# Patient Record
Sex: Female | Born: 1935 | Race: White | Hispanic: No | Smoking: Never smoker
Health system: Southern US, Community
[De-identification: ages and names within clinical notes are randomized; demographics above are authoritative.]

## PROBLEM LIST (undated history)

## (undated) DIAGNOSIS — M810 Age-related osteoporosis without current pathological fracture: Secondary | ICD-10-CM

## (undated) DIAGNOSIS — R011 Cardiac murmur, unspecified: Secondary | ICD-10-CM

## (undated) DIAGNOSIS — I10 Essential (primary) hypertension: Secondary | ICD-10-CM

## (undated) DIAGNOSIS — H9190 Unspecified hearing loss, unspecified ear: Secondary | ICD-10-CM

## (undated) DIAGNOSIS — I252 Old myocardial infarction: Secondary | ICD-10-CM

## (undated) DIAGNOSIS — D649 Anemia, unspecified: Secondary | ICD-10-CM

## (undated) DIAGNOSIS — R7309 Other abnormal glucose: Secondary | ICD-10-CM

## (undated) DIAGNOSIS — I2510 Atherosclerotic heart disease of native coronary artery without angina pectoris: Secondary | ICD-10-CM

## (undated) DIAGNOSIS — C801 Malignant (primary) neoplasm, unspecified: Secondary | ICD-10-CM

## (undated) DIAGNOSIS — S22060A Wedge compression fracture of T7-T8 vertebra, initial encounter for closed fracture: Secondary | ICD-10-CM

## (undated) HISTORY — PX: FRACTURE SURGERY: SHX138

## (undated) HISTORY — PX: COLON SURGERY: SHX602

---

## 2019-01-05 ENCOUNTER — Emergency Department (HOSPITAL_COMMUNITY): Payer: Medicare Other

## 2019-01-05 ENCOUNTER — Other Ambulatory Visit: Payer: Self-pay

## 2019-01-05 ENCOUNTER — Inpatient Hospital Stay (HOSPITAL_COMMUNITY)
Admission: EM | Admit: 2019-01-05 | Discharge: 2019-01-11 | DRG: 481 | Disposition: A | Discharge status: Skilled Nursing Facility | Payer: Medicare Other | Attending: Internal Medicine | Admitting: Internal Medicine

## 2019-01-05 ENCOUNTER — Encounter (HOSPITAL_COMMUNITY): Payer: Self-pay | Admitting: Emergency Medicine

## 2019-01-05 DIAGNOSIS — Z20828 Contact with and (suspected) exposure to other viral communicable diseases: Secondary | ICD-10-CM | POA: Diagnosis not present

## 2019-01-05 DIAGNOSIS — Z87891 Personal history of nicotine dependence: Secondary | ICD-10-CM

## 2019-01-05 DIAGNOSIS — Z66 Do not resuscitate: Secondary | ICD-10-CM | POA: Diagnosis not present

## 2019-01-05 DIAGNOSIS — Z79899 Other long term (current) drug therapy: Secondary | ICD-10-CM

## 2019-01-05 DIAGNOSIS — S72142A Displaced intertrochanteric fracture of left femur, initial encounter for closed fracture: Principal | ICD-10-CM | POA: Diagnosis present

## 2019-01-05 DIAGNOSIS — Z9181 History of falling: Secondary | ICD-10-CM

## 2019-01-05 DIAGNOSIS — Z85828 Personal history of other malignant neoplasm of skin: Secondary | ICD-10-CM

## 2019-01-05 DIAGNOSIS — M8580 Other specified disorders of bone density and structure, unspecified site: Secondary | ICD-10-CM

## 2019-01-05 DIAGNOSIS — E876 Hypokalemia: Secondary | ICD-10-CM | POA: Diagnosis not present

## 2019-01-05 DIAGNOSIS — I10 Essential (primary) hypertension: Secondary | ICD-10-CM | POA: Diagnosis present

## 2019-01-05 DIAGNOSIS — S62307A Unspecified fracture of fifth metacarpal bone, left hand, initial encounter for closed fracture: Secondary | ICD-10-CM | POA: Diagnosis present

## 2019-01-05 DIAGNOSIS — I2510 Atherosclerotic heart disease of native coronary artery without angina pectoris: Secondary | ICD-10-CM | POA: Diagnosis present

## 2019-01-05 DIAGNOSIS — S42202D Unspecified fracture of upper end of left humerus, subsequent encounter for fracture with routine healing: Secondary | ICD-10-CM | POA: Diagnosis not present

## 2019-01-05 DIAGNOSIS — H9190 Unspecified hearing loss, unspecified ear: Secondary | ICD-10-CM | POA: Diagnosis present

## 2019-01-05 DIAGNOSIS — D62 Acute posthemorrhagic anemia: Secondary | ICD-10-CM | POA: Diagnosis not present

## 2019-01-05 DIAGNOSIS — S22060D Wedge compression fracture of T7-T8 vertebra, subsequent encounter for fracture with routine healing: Secondary | ICD-10-CM | POA: Diagnosis not present

## 2019-01-05 DIAGNOSIS — W19XXXA Unspecified fall, initial encounter: Secondary | ICD-10-CM

## 2019-01-05 DIAGNOSIS — Z85038 Personal history of other malignant neoplasm of large intestine: Secondary | ICD-10-CM | POA: Diagnosis not present

## 2019-01-05 DIAGNOSIS — Y92009 Unspecified place in unspecified non-institutional (private) residence as the place of occurrence of the external cause: Secondary | ICD-10-CM

## 2019-01-05 DIAGNOSIS — I252 Old myocardial infarction: Secondary | ICD-10-CM | POA: Diagnosis not present

## 2019-01-05 DIAGNOSIS — S72002A Fracture of unspecified part of neck of left femur, initial encounter for closed fracture: Secondary | ICD-10-CM | POA: Diagnosis not present

## 2019-01-05 DIAGNOSIS — I272 Pulmonary hypertension, unspecified: Secondary | ICD-10-CM | POA: Diagnosis not present

## 2019-01-05 DIAGNOSIS — W010XXA Fall on same level from slipping, tripping and stumbling without subsequent striking against object, initial encounter: Secondary | ICD-10-CM | POA: Diagnosis present

## 2019-01-05 DIAGNOSIS — S42295D Other nondisplaced fracture of upper end of left humerus, subsequent encounter for fracture with routine healing: Secondary | ICD-10-CM

## 2019-01-05 DIAGNOSIS — Z419 Encounter for procedure for purposes other than remedying health state, unspecified: Secondary | ICD-10-CM

## 2019-01-05 DIAGNOSIS — D638 Anemia in other chronic diseases classified elsewhere: Secondary | ICD-10-CM | POA: Diagnosis not present

## 2019-01-05 DIAGNOSIS — R296 Repeated falls: Secondary | ICD-10-CM | POA: Diagnosis present

## 2019-01-05 DIAGNOSIS — S62367D Nondisplaced fracture of neck of fifth metacarpal bone, left hand, subsequent encounter for fracture with routine healing: Secondary | ICD-10-CM

## 2019-01-05 DIAGNOSIS — I350 Nonrheumatic aortic (valve) stenosis: Secondary | ICD-10-CM | POA: Diagnosis present

## 2019-01-05 DIAGNOSIS — S62367A Nondisplaced fracture of neck of fifth metacarpal bone, left hand, initial encounter for closed fracture: Secondary | ICD-10-CM

## 2019-01-05 DIAGNOSIS — S42202A Unspecified fracture of upper end of left humerus, initial encounter for closed fracture: Secondary | ICD-10-CM | POA: Diagnosis present

## 2019-01-05 DIAGNOSIS — Z01810 Encounter for preprocedural cardiovascular examination: Secondary | ICD-10-CM | POA: Diagnosis not present

## 2019-01-05 DIAGNOSIS — R011 Cardiac murmur, unspecified: Secondary | ICD-10-CM | POA: Diagnosis not present

## 2019-01-05 DIAGNOSIS — M810 Age-related osteoporosis without current pathological fracture: Secondary | ICD-10-CM | POA: Diagnosis present

## 2019-01-05 DIAGNOSIS — N179 Acute kidney failure, unspecified: Secondary | ICD-10-CM | POA: Diagnosis not present

## 2019-01-05 DIAGNOSIS — S42295A Other nondisplaced fracture of upper end of left humerus, initial encounter for closed fracture: Secondary | ICD-10-CM

## 2019-01-05 HISTORY — DX: Other abnormal glucose: R73.09

## 2019-01-05 HISTORY — DX: Atherosclerotic heart disease of native coronary artery without angina pectoris: I25.10

## 2019-01-05 HISTORY — DX: Age-related osteoporosis without current pathological fracture: M81.0

## 2019-01-05 HISTORY — DX: Malignant (primary) neoplasm, unspecified: C80.1

## 2019-01-05 HISTORY — DX: Old myocardial infarction: I25.2

## 2019-01-05 HISTORY — DX: Essential (primary) hypertension: I10

## 2019-01-05 HISTORY — DX: Unspecified hearing loss, unspecified ear: H91.90

## 2019-01-05 HISTORY — DX: Anemia, unspecified: D64.9

## 2019-01-05 HISTORY — DX: Cardiac murmur, unspecified: R01.1

## 2019-01-05 HISTORY — DX: Hypercalcemia: E83.52

## 2019-01-05 HISTORY — DX: Wedge compression fracture of T7-T8 vertebra, initial encounter for closed fracture: S22.060A

## 2019-01-05 LAB — TYPE AND SCREEN
ABO/RH(D): O NEG
Antibody Screen: POSITIVE

## 2019-01-05 LAB — CBC WITH DIFFERENTIAL/PLATELET
Abs Immature Granulocytes: 0.03 10*3/uL (ref 0.00–0.07)
Basophils Absolute: 0 10*3/uL (ref 0.0–0.1)
Basophils Relative: 0 %
Eosinophils Absolute: 0 10*3/uL (ref 0.0–0.5)
Eosinophils Relative: 1 %
HCT: 33 % — ABNORMAL LOW (ref 36.0–46.0)
Hemoglobin: 10.2 g/dL — ABNORMAL LOW (ref 12.0–15.0)
Immature Granulocytes: 0 %
Lymphocytes Relative: 5 %
Lymphs Abs: 0.4 10*3/uL — ABNORMAL LOW (ref 0.7–4.0)
MCH: 30 pg (ref 26.0–34.0)
MCHC: 30.9 g/dL (ref 30.0–36.0)
MCV: 97.1 fL (ref 80.0–100.0)
Monocytes Absolute: 0.6 10*3/uL (ref 0.1–1.0)
Monocytes Relative: 6 %
Neutro Abs: 7.8 10*3/uL — ABNORMAL HIGH (ref 1.7–7.7)
Neutrophils Relative %: 88 %
Platelets: 244 10*3/uL (ref 150–400)
RBC: 3.4 MIL/uL — ABNORMAL LOW (ref 3.87–5.11)
RDW: 13.5 % (ref 11.5–15.5)
WBC: 8.9 10*3/uL (ref 4.0–10.5)
nRBC: 0 % (ref 0.0–0.2)

## 2019-01-05 LAB — URINALYSIS, ROUTINE W REFLEX MICROSCOPIC
Bilirubin Urine: NEGATIVE
Glucose, UA: NEGATIVE mg/dL
Hgb urine dipstick: NEGATIVE
Ketones, ur: 20 mg/dL — AB
Leukocytes,Ua: NEGATIVE
Nitrite: NEGATIVE
Protein, ur: NEGATIVE mg/dL
Specific Gravity, Urine: 1.017 (ref 1.005–1.030)
pH: 5 (ref 5.0–8.0)

## 2019-01-05 LAB — BASIC METABOLIC PANEL
Anion gap: 9 (ref 5–15)
BUN: 13 mg/dL (ref 8–23)
CO2: 28 mmol/L (ref 22–32)
Calcium: 9.2 mg/dL (ref 8.9–10.3)
Chloride: 108 mmol/L (ref 98–111)
Creatinine, Ser: 0.58 mg/dL (ref 0.44–1.00)
GFR calc Af Amer: >60 mL/min (ref >60)
GFR calc non Af Amer: >60 mL/min (ref >60)
Glucose, Bld: 135 mg/dL — ABNORMAL HIGH (ref 70–99)
Potassium: 3 mmol/L — ABNORMAL LOW (ref 3.5–5.1)
Sodium: 145 mmol/L (ref 135–145)

## 2019-01-05 LAB — PROTIME-INR
INR: 1.1 (ref 0.8–1.2)
Prothrombin Time: 13.6 seconds (ref 11.4–15.2)

## 2019-01-05 LAB — MAGNESIUM: Magnesium: 1.9 mg/dL (ref 1.7–2.4)

## 2019-01-05 LAB — TROPONIN I (HIGH SENSITIVITY)
Troponin I (High Sensitivity): 8 ng/L (ref <18)
Troponin I (High Sensitivity): 8 ng/L (ref <18)

## 2019-01-05 LAB — SARS CORONAVIRUS 2 (TAT 6-24 HRS): SARS Coronavirus 2: NEGATIVE

## 2019-01-05 MED ORDER — MORPHINE SULFATE (PF) 2 MG/ML IV SOLN
0.5000 mg | INTRAVENOUS | Status: DC | PRN
  Administered 2019-01-05 – 2019-01-08 (×3): 0.5 mg via INTRAVENOUS
  Filled 2019-01-05 (×3): qty 1

## 2019-01-05 MED ORDER — POVIDONE-IODINE 10 % EX SWAB
2.0000 "application " | Freq: Once | CUTANEOUS | Status: AC
  Administered 2019-01-06: 2 via TOPICAL

## 2019-01-05 MED ORDER — FENTANYL CITRATE (PF) 100 MCG/2ML IJ SOLN
50.0000 ug | INTRAMUSCULAR | Status: AC | PRN
  Administered 2019-01-05 (×2): 50 ug via INTRAVENOUS
  Filled 2019-01-05 (×2): qty 2

## 2019-01-05 MED ORDER — ENSURE PRE-SURGERY PO LIQD
296.0000 mL | Freq: Once | ORAL | Status: AC
  Administered 2019-01-06: 296 mL via ORAL
  Filled 2019-01-05: qty 296

## 2019-01-05 MED ORDER — CHLORHEXIDINE GLUCONATE 4 % EX LIQD
60.0000 mL | Freq: Once | CUTANEOUS | Status: AC
  Administered 2019-01-06: 4 via TOPICAL

## 2019-01-05 MED ORDER — POTASSIUM CHLORIDE CRYS ER 20 MEQ PO TBCR
40.0000 meq | EXTENDED_RELEASE_TABLET | Freq: Once | ORAL | Status: AC
  Administered 2019-01-05: 40 meq via ORAL
  Filled 2019-01-05: qty 2

## 2019-01-05 MED ORDER — HEPARIN SODIUM (PORCINE) 5000 UNIT/ML IJ SOLN
5000.0000 [IU] | Freq: Three times a day (TID) | INTRAMUSCULAR | Status: DC
  Administered 2019-01-05 – 2019-01-06 (×4): 5000 [IU] via SUBCUTANEOUS
  Filled 2019-01-05 (×4): qty 1

## 2019-01-05 MED ORDER — POTASSIUM CHLORIDE CRYS ER 20 MEQ PO TBCR
40.0000 meq | EXTENDED_RELEASE_TABLET | Freq: Once | ORAL | Status: AC
  Administered 2019-01-05: 15:00:00 40 meq via ORAL
  Filled 2019-01-05: qty 2

## 2019-01-05 MED ORDER — ONDANSETRON HCL 4 MG/2ML IJ SOLN
4.0000 mg | Freq: Once | INTRAMUSCULAR | Status: AC
  Administered 2019-01-05: 4 mg via INTRAVENOUS
  Filled 2019-01-05: qty 2

## 2019-01-05 MED ORDER — CEFAZOLIN SODIUM-DEXTROSE 2-4 GM/100ML-% IV SOLN
2.0000 g | INTRAVENOUS | Status: DC
  Filled 2019-01-05: qty 100

## 2019-01-05 MED ORDER — HYDROCODONE-ACETAMINOPHEN 5-325 MG PO TABS
1.0000 | ORAL_TABLET | Freq: Four times a day (QID) | ORAL | Status: DC | PRN
  Administered 2019-01-05 – 2019-01-11 (×5): 1 via ORAL
  Filled 2019-01-05 (×6): qty 1

## 2019-01-05 NOTE — ED Triage Notes (Signed)
Patient fell today and has pain in the left hip with obvious shortening of the leg according to ems. She did have an injury to the femoral head in September, but the family reported no fracture.

## 2019-01-05 NOTE — Progress Notes (Signed)
Patient ID: Ann Garza, female   DOB: Feb 26, 1922, 85 y.o.   MRN: [id number]  Received consult from [facility] regarding hip fx. Unable to be done there 2/2 heart history. Will need cardiac clearance and will plan to fix tomorrow if she's cleared. NPO after MN. Will see in AM for full consult.    Lisette Abu, PA-C Orthopedic Surgery [phone]

## 2019-01-05 NOTE — H&P (Signed)
History and Physical    Ann Garza [id number] DOB: September 28, 1922 DOA: 01/05/2019  PCP: System, Provider Not In  Patient coming from: home  I have personally briefly reviewed patient's old medical records in [place]  Chief Complaint: fall  HPI: Ann Garza is a 85 y.o. female with medical history significant of falls, is brought to the hospital today after suffering a fall.  Her most recent fall was on 11/2 when she was evaluated at [facility] and was found to have a left humerus fracture.  Patient reports that she had no dizziness prior to her fall today.  She did not complain of chest pain or shortness of breath.  She did not lose consciousness.  She has not had any fever, cough.  Patient was evaluated in the emergency room where x-rays confirmed left-sided hip fracture.  Case was reviewed with orthopedics, Dr. Aline Brochure who felt that patient was too high risk from cardiac perspective to undergo surgery at [facility] and is recommended transfer to [facility].  EDP discussed case with Dr. Doreatha Martin on-call for orthopedics at [facility].  Patient be transferred there for further operative management.  Review of Systems:  General: Positive for generalized weakness, negative for fever, weight loss Respiratory: Negative for shortness of breath, cough, wheezing Cardiac: Negative for chest pain, palpitations GI: Negative for nausea, vomiting, diarrhea All other systems reviewed and found to be negative, pertinent positives noted in HPI.   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 She does not have any smokeless tobacco history on file. She reports previous alcohol use. She reports that she does not use drugs.  No Known Allergies  Family history: Family history reviewed and not pertinent  Prior to Admission medications   Medication Sig Start Date End Date Taking? Authorizing Provider  Multiple Vitamin (MULTIVITAMIN WITH MINERALS) TABS tablet Take 1 tablet by mouth daily.   Yes [provider]  mupirocin ointment (BACTROBAN) 2 % Apply 1 application topically 3 (three) times daily.  01/03/19 01/03/20 Yes [provider]  [place] See admin instructions. Nystatin-Hydrocortisone-Zinc Oxide-Lanolin-dermabase (1:1:1:1:1)  - Apply topically twice daily   Yes [provider]  fluconazole (DIFLUCAN) 150 MG tablet Take by mouth. 01/03/19 01/06/19  [provider]    Physical Exam: Vitals:   01/05/19 1700 01/05/19 1730 01/05/19 1800 01/05/19 1830  BP: (!) 159/75 (!) 158/76 (!) 141/64 (!) 148/68  Pulse: 94 93 99 87  Resp: (!) 22 18 16 18   Temp:      TempSrc:      SpO2: 97% 96% 96% 97%  Weight:      Height:        Constitutional: NAD, calm, comfortable Eyes: PERRL, lids and conjunctivae normal ENMT: Mucous membranes are moist. Posterior pharynx clear of any exudate or lesions.Normal dentition.  Neck: normal,  supple, no masses, no thyromegaly Respiratory: clear to auscultation bilaterally, no wheezing, no crackles. Normal respiratory effort. No accessory muscle use.  Cardiovascular: Regular rate and rhythm, no murmurs / rubs / gallops. No extremity edema. 2+ pedal pulses. No carotid bruits.  Abdomen: no tenderness, no masses palpated. No hepatosplenomegaly. Bowel sounds positive.  Musculoskeletal: Left lower extremity is shortened and externally rotated.  Skin: no rashes, lesions, ulcers. No induration Neurologic: Limited exam by  pain, no gross focal neurologic deficits Psychiatric: Normal judgment and insight. Alert and oriented x 3. Normal mood.    Labs on Admission: I have personally reviewed following labs and imaging studies  CBC: Recent Labs  Lab 01/05/19 1155  WBC 8.9  NEUTROABS 7.8*  HGB 10.2*  HCT 33.0*  MCV 97.1  PLT [id number]   Basic Metabolic Panel: Recent Labs  Lab 01/05/19 1155 01/05/19 1500  NA 145  --   K 3.0*  --   CL 108  --   CO2 28  --   GLUCOSE 135*  --   BUN 13  --   CREATININE 0.58  --   CALCIUM 9.2  --   MG  --  1.9   GFR: Estimated Creatinine Clearance: 28.3 mL/min (by C-G formula based on SCr of 0.58 mg/dL). Liver Function Tests: No results for input(s): AST, ALT, ALKPHOS, BILITOT, PROT, ALBUMIN in the last 168 hours. No results for input(s): LIPASE, AMYLASE in the last 168 hours. No results for input(s): AMMONIA in the last 168 hours. Coagulation Profile: Recent Labs  Lab 01/05/19 1155  INR 1.1   Cardiac Enzymes: No results for input(s): CKTOTAL, CKMB, CKMBINDEX, TROPONINI in the last 168 hours. BNP (last 3 results) No results for input(s): PROBNP in the last 8760 hours. HbA1C: No results for input(s): HGBA1C in the last 72 hours. CBG: No results for input(s): GLUCAP in the last 168 hours. Lipid Profile: No results for input(s): CHOL, HDL, LDLCALC, TRIG, CHOLHDL, LDLDIRECT in the last 72 hours. Thyroid Function Tests: No results for input(s): TSH, T4TOTAL, FREET4, T3FREE, THYROIDAB in the last 72 hours. Anemia Panel: No results for input(s): VITAMINB12, FOLATE, FERRITIN, TIBC, IRON, RETICCTPCT in the last 72 hours. Urine analysis:    Component Value Date/Time   COLORURINE YELLOW 01/05/2019 1128   APPEARANCEUR HAZY (A) 01/05/2019 1128   LABSPEC 1.017 01/05/2019 1128   PHURINE 5.0 01/05/2019 1128   GLUCOSEU NEGATIVE 01/05/2019 1128   HGBUR NEGATIVE 01/05/2019 1128   BILIRUBINUR NEGATIVE 01/05/2019 1128   KETONESUR 20 (A) 01/05/2019 1128   PROTEINUR  NEGATIVE 01/05/2019 1128   NITRITE NEGATIVE 01/05/2019 1128   LEUKOCYTESUR NEGATIVE 01/05/2019 1128    Radiological Exams on Admission: Dg Chest 1 View  Result Date: 01/05/2019 CLINICAL DATA:  85 year old female with history of trauma from a fall today. EXAM: CHEST  1 VIEW COMPARISON:  No priors. FINDINGS: Lung volumes are low. Mild elevation of the right hemidiaphragm, of uncertain chronicity. No acute consolidative airspace disease. No pneumothorax. No definite suspicious appearing pulmonary nodules or masses are noted. No pleural effusions. No evidence of pulmonary edema. Heart size is mildly enlarged. Upper mediastinal contours are within normal limits. Aortic atherosclerosis. Visualized bony thorax appears grossly intact. IMPRESSION: 1. Low lung volumes without radiographic evidence of acute cardiopulmonary disease. 2. Aortic atherosclerosis. 3. Mild cardiomegaly. Electronically Signed   By: Vinnie Langton M.D.   On: 01/05/2019 12:59   Ct Head Wo Contrast  Result Date: 01/05/2019 CLINICAL DATA:  85 year old female with fall. EXAM: CT HEAD WITHOUT  CONTRAST TECHNIQUE: Contiguous axial images were obtained from the base of the skull through the vertex without intravenous contrast. COMPARISON:  None. FINDINGS: Brain: There is mild age-related atrophy and chronic microvascular ischemic changes. There is no acute intracranial hemorrhage. No mass effect or midline shift. No extra-axial fluid collection. Vascular: No hyperdense vessel or unexpected calcification. Skull: Normal. Negative for fracture or focal lesion. Sinuses/Orbits: No acute finding. Other: Left posterior parietal scalp contusion. IMPRESSION: 1. No acute intracranial hemorrhage. 2. Mild age-related atrophy and chronic microvascular ischemic changes. Electronically Signed   By: Anner Crete M.D.   On: 01/05/2019 12:53   Dg Humerus Left  Result Date: 01/05/2019 CLINICAL DATA:  Fall, pre-existing fracture EXAM: LEFT HUMERUS - 2+  VIEW COMPARISON:  None. FINDINGS: Osteopenia. There is an impacted fracture of the surgical neck of the left humerus, which appears to have some evidence of resorption about the fracture fragments and may be subacute. No other fracture or dislocation of the left humerus. Soft tissues are unremarkable. IMPRESSION: 1. Osteopenia. 2. There is an impacted fracture of the surgical neck of the left humerus, which appears to have some evidence of resorption about the fracture fragments and may be subacute. No other fracture or dislocation of the left humerus. Electronically Signed   By: Eddie Candle M.D.   On: 01/05/2019 14:29   Dg Hand Complete Left  Result Date: 01/05/2019 CLINICAL DATA:  Fall, pain EXAM: LEFT HAND - COMPLETE 3+ VIEW COMPARISON:  None. FINDINGS: Profound osteopenia. There is an angulated fracture of the distal left fifth metacarpal. 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 Intravenous catheter in the dorsum of the wrist. IMPRESSION: 1. Profound osteopenia. 2. There is an angulated fracture of the distal left fifth metacarpal. 3. 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 Electronically Signed   By: Eddie Candle M.D.   On: 01/05/2019 14:27   Dg Hip Unilat With Pelvis 2-3 Views Left  Result Date: 01/05/2019 CLINICAL DATA:  Patient fell today and has pain in the left hip with obvious shortening of the leg according to ems. She did have an injury to the femoral head in September, but the family reported no fracture. Hx HTN, CAD, nonsmoker EXAM: DG HIP (WITH OR WITHOUT PELVIS) 2-3V LEFT COMPARISON:  None. FINDINGS: There is a comminuted intertrochanteric fracture of the proximal left femur. The fracture is displaced, distal shaft fracture component displacing laterally by 2 cm. Lesser and greater trochanteric fracture components are also displaced. There is varus angulation. No other fractures.  No bone lesions. Hip joints, SI joints and symphysis  pubis are normally aligned. There is soft tissue edema lateral to the fracture proximal left femur. IMPRESSION: 1. Comminuted, mildly displaced and varus angulated intertrochanteric fracture of the proximal left femur. No dislocation. Electronically Signed   By: Lajean Manes M.D.   On: 01/05/2019 12:59    EKG: Independently reviewed.  Sinus rhythm without any acute changes.  Assessment/Plan Active Problems:   Closed left hip fracture (HCC)   Fall at home, initial encounter   Closed fracture of proximal end of left humerus   Unsp fracture of fifth metacarpal bone, left hand, init   Osteopenia   Hypokalemia     1. Closed left hip fracture.  Secondary to fall.  Plans are for transfer to [facility] for operative management.  We will keep n.p.o. after midnight.  Start on heparin for DVT prophylaxis.  Will need physical therapy evaluation postoperatively.  Will consult  cardiology for preoperative cardiac clearance. 2. Fracture of distal left fifth metacarpal.  Defer further management to orthopedics 3. Recent left humerus fracture.  Continue supportive management per orthopedics 4. Hypokalemia.  Replace.  Check magnesium  DVT prophylaxis: Heparin Code Status: DNR Family Communication: Discussed with son at the bedside Disposition Plan: Transfer to [facility] for further operative management Consults called: Orthopedics, Dr. Doreatha Martin Admission status: Inpatient, MedSurg  Kathie Dike MD [facility]   If 7PM-7AM, please contact night-coverage [email or web address]   01/05/2019, 7:05 PM

## 2019-01-05 NOTE — ED Provider Notes (Signed)
[facility] EMERGENCY DEPARTMENT Provider Note   CSN: [id number] Arrival date & time: 01/05/19  1118     History   Chief Complaint Chief Complaint  Patient presents with   Fall    HPI Ann Garza is a 85 y.o. female.     Pt presents to the ED today with a fall and left hip pain.  The pt has a hx of frequent falls.  She lives with her family.  She is a poor historian.    Pt's son said she used to live in [place], [place] and has been living with him for the last few weeks.  She had multiple falls.  She does walk a little, but uses a wheelchair most of the time.  The pt's son said she was standing and fell over.  He said he was 6 feet away and could not catch her.  She has been healing from a left proximal humerus fx.  She saw her pcp for a routine wellness visit on 11/10 and had an [place] of her LUE which was negative for DVT.        Past Medical History:  Diagnosis Date   Anemia    Cancer ([place])    non melenoma skin/ Colon   Coronary artery disease    cath 06/15/11 EF55%   Elevated glucose    Hearing loss    Heart murmur    suspect aortic stenosis   Hypercalcemia    Hypertension    MI, old    Osteoporosis    Wedge compression fracture of T8 vertebra [facility])     Patient Active Problem List   Diagnosis Date Noted   Closed left hip fracture ([place]) 01/05/2019    Past Surgical History:  Procedure Laterality Date   COLON SURGERY     FRACTURE SURGERY     arm     OB History   No obstetric history on file.      Home Medications    Prior to Admission medications   Medication Sig Start Date End Date Taking? Authorizing Provider  Multiple Vitamin (MULTIVITAMIN WITH MINERALS) TABS tablet Take 1 tablet by mouth daily.   Yes [provider]  mupirocin ointment (BACTROBAN) 2 % Apply 1 application topically 3 (three) times daily.  01/03/19 01/03/20 Yes [provider]  [place] See admin instructions.  Nystatin-Hydrocortisone-Zinc Oxide-Lanolin-dermabase (1:1:1:1:1)  - Apply topically twice daily   Yes [provider]  fluconazole (DIFLUCAN) 150 MG tablet Take by mouth. 01/03/19 01/06/19  [provider]    Family History No family history on file.  Social History Social History   Tobacco Use   Smoking status: Never Smoker  Substance Use Topics   Alcohol use: Not Currently   Drug use: Never     Allergies   Patient has no known allergies.   Review of Systems Review of Systems  Musculoskeletal:       Left hip pain  All other systems reviewed and are negative.    Physical Exam Updated Vital Signs BP (!) 184/87    Pulse 87    Temp 98.5 F (36.9 C) (Oral)    Resp 17    Ht 4\' 11"  (1.499 m)    Wt 41.7 kg    SpO2 96%    BMI 18.58 kg/m   Physical Exam Vitals signs and nursing note reviewed.  Constitutional:      Appearance: Normal appearance.  HENT:     Head: Normocephalic.  Comments: Recent skin cancer removals to face    Right Ear: External ear normal.     Left Ear: External ear normal.     Nose: Nose normal.     Mouth/Throat:     Mouth: Mucous membranes are moist.     Pharynx: Oropharynx is clear.  Eyes:     Extraocular Movements: Extraocular movements intact.     Conjunctiva/sclera: Conjunctivae normal.     Pupils: Pupils are equal, round, and reactive to light.  Neck:     Musculoskeletal: Normal range of motion and neck supple.  Cardiovascular:     Rate and Rhythm: Normal rate and regular rhythm.     Pulses: Normal pulses.     Heart sounds: Normal heart sounds.  Pulmonary:     Effort: Pulmonary effort is normal.     Breath sounds: Normal breath sounds.  Abdominal:     General: Abdomen is flat. Bowel sounds are normal.  Musculoskeletal:     Left hip: She exhibits decreased range of motion and tenderness.     Comments: Edema to LUE.  Skin:    General: Skin is warm.     Capillary Refill: Capillary refill takes less than 2  seconds.     Comments: Multiple skin tears  Neurological:     Mental Status: She is alert. Mental status is at baseline.  Psychiatric:        Mood and Affect: Mood normal.        Behavior: Behavior normal.        Thought Content: Thought content normal.        Judgment: Judgment normal.      ED Treatments / Results  Labs (all labs ordered are listed, but only abnormal results are displayed) Labs Reviewed  BASIC METABOLIC PANEL - Abnormal; Notable for the following components:      Result Value   Potassium 3.0 (*)    Glucose, Bld 135 (*)    All other components within normal limits  CBC WITH DIFFERENTIAL/PLATELET - Abnormal; Notable for the following components:   RBC 3.40 (*)    Hemoglobin 10.2 (*)    HCT 33.0 (*)    Neutro Abs 7.8 (*)    Lymphs Abs 0.4 (*)    All other components within normal limits  SARS CORONAVIRUS 2 (TAT 6-24 HRS)  PROTIME-INR  URINALYSIS, ROUTINE W REFLEX MICROSCOPIC  MAGNESIUM  TYPE AND SCREEN  TROPONIN I (HIGH SENSITIVITY)    EKG EKG Interpretation  Date/Time:  Thursday January 05 2019 11:31:50 EST Ventricular Rate:  76 PR Interval:    QRS Duration: 83 QT Interval:  433 QTC Calculation: 487 R Axis:   7 Text Interpretation: Sinus rhythm Atrial premature complex Abnormal R-wave progression, early transition Borderline prolonged QT interval No old tracing to compare Confirmed by Isla Pence [phone]) on 01/05/2019 11:36:12 AM   Radiology Dg Chest 1 View  Result Date: 01/05/2019 CLINICAL DATA:  85 year old female with history of trauma from a fall today. EXAM: CHEST  1 VIEW COMPARISON:  No priors. FINDINGS: Lung volumes are low. Mild elevation of the right hemidiaphragm, of uncertain chronicity. No acute consolidative airspace disease. No pneumothorax. No definite suspicious appearing pulmonary nodules or masses are noted. No pleural effusions. No evidence of pulmonary edema. Heart size is mildly enlarged. Upper mediastinal contours are  within normal limits. Aortic atherosclerosis. Visualized bony thorax appears grossly intact. IMPRESSION: 1. Low lung volumes without radiographic evidence of acute cardiopulmonary disease. 2. Aortic atherosclerosis. 3. Mild cardiomegaly. Electronically  Signed   By: Vinnie Langton M.D.   On: 01/05/2019 12:59   Ct Head Wo Contrast  Result Date: 01/05/2019 CLINICAL DATA:  85 year old female with fall. EXAM: CT HEAD WITHOUT CONTRAST TECHNIQUE: Contiguous axial images were obtained from the base of the skull through the vertex without intravenous contrast. COMPARISON:  None. FINDINGS: Brain: There is mild age-related atrophy and chronic microvascular ischemic changes. There is no acute intracranial hemorrhage. No mass effect or midline shift. No extra-axial fluid collection. Vascular: No hyperdense vessel or unexpected calcification. Skull: Normal. Negative for fracture or focal lesion. Sinuses/Orbits: No acute finding. Other: Left posterior parietal scalp contusion. IMPRESSION: 1. No acute intracranial hemorrhage. 2. Mild age-related atrophy and chronic microvascular ischemic changes. Electronically Signed   By: Anner Crete M.D.   On: 01/05/2019 12:53   Dg Humerus Left  Result Date: 01/05/2019 CLINICAL DATA:  Fall, pre-existing fracture EXAM: LEFT HUMERUS - 2+ VIEW COMPARISON:  None. FINDINGS: Osteopenia. There is an impacted fracture of the surgical neck of the left humerus, which appears to have some evidence of resorption about the fracture fragments and may be subacute. No other fracture or dislocation of the left humerus. Soft tissues are unremarkable. IMPRESSION: 1. Osteopenia. 2. There is an impacted fracture of the surgical neck of the left humerus, which appears to have some evidence of resorption about the fracture fragments and may be subacute. No other fracture or dislocation of the left humerus. Electronically Signed   By: Eddie Candle M.D.   On: 01/05/2019 14:29   Dg Hand Complete  Left  Result Date: 01/05/2019 CLINICAL DATA:  Fall, pain EXAM: LEFT HAND - COMPLETE 3+ VIEW COMPARISON:  None. FINDINGS: Profound osteopenia. There is an angulated fracture of the distal left fifth metacarpal. 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 Intravenous catheter in the dorsum of the wrist. IMPRESSION: 1. Profound osteopenia. 2. There is an angulated fracture of the distal left fifth metacarpal. 3. 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 Electronically Signed   By: Eddie Candle M.D.   On: 01/05/2019 14:27   Dg Hip Unilat With Pelvis 2-3 Views Left  Result Date: 01/05/2019 CLINICAL DATA:  Patient fell today and has pain in the left hip with obvious shortening of the leg according to ems. She did have an injury to the femoral head in September, but the family reported no fracture. Hx HTN, CAD, nonsmoker EXAM: DG HIP (WITH OR WITHOUT PELVIS) 2-3V LEFT COMPARISON:  None. FINDINGS: There is a comminuted intertrochanteric fracture of the proximal left femur. The fracture is displaced, distal shaft fracture component displacing laterally by 2 cm. Lesser and greater trochanteric fracture components are also displaced. There is varus angulation. No other fractures.  No bone lesions. Hip joints, SI joints and symphysis pubis are normally aligned. There is soft tissue edema lateral to the fracture proximal left femur. IMPRESSION: 1. Comminuted, mildly displaced and varus angulated intertrochanteric fracture of the proximal left femur. No dislocation. Electronically Signed   By: Lajean Manes M.D.   On: 01/05/2019 12:59    Procedures Procedures (including critical care time)  Medications Ordered in ED Medications  fentaNYL (SUBLIMAZE) injection 50 mcg (50 mcg Intravenous Given 01/05/19 1157)  potassium chloride SA (KLOR-CON) CR tablet 40 mEq (has no administration in time range)  ondansetron (ZOFRAN) injection 4 mg (4 mg Intravenous Given 01/05/19  1157)     Initial Impression / Assessment and Plan / ED Course  I have reviewed the triage  vital signs and the nursing notes.  Pertinent labs & imaging results that were available during my care of the patient were reviewed by me and considered in my medical decision making (see chart for details).  Pt d/w Dr. Aline Brochure (ortho) who said he'd look at her chart.  He does not think it's appropriate to do pt's surgery here as she has a heart hx.  Pt d/w Silvestre Gunner (ortho) who said they can operate on her tomorrow at [facility].  K is low at 3.0.  That is replaced.  Mg level ordered.  Pt also has a subacute left proximal humerus fx.  She is put in a sling.  She also has a left 5th mc fx.  She is placed in a splint  Pt d/w Dr. Roderic Palau ([facility]) who will admit.     Final Clinical Impressions(s) / ED Diagnoses   Final diagnoses:  Fall, initial encounter  Closed displaced intertrochanteric fracture of left femur, initial encounter ([place])  Hypokalemia  Other closed nondisplaced fracture of proximal end of left humerus, initial encounter  Closed nondisplaced fracture of neck of fifth metacarpal bone of left hand, initial encounter    ED Discharge Orders    None       Isla Pence, MD 01/05/19 1458

## 2019-01-06 ENCOUNTER — Encounter (HOSPITAL_COMMUNITY): Payer: Self-pay | Admitting: Anesthesiology

## 2019-01-06 ENCOUNTER — Encounter (HOSPITAL_COMMUNITY): Payer: Self-pay | Admitting: *Deleted

## 2019-01-06 ENCOUNTER — Inpatient Hospital Stay (HOSPITAL_COMMUNITY): Payer: Medicare Other

## 2019-01-06 ENCOUNTER — Encounter (HOSPITAL_COMMUNITY)
Admission: EM | Disposition: A | Discharge status: Skilled Nursing Facility | Payer: Self-pay | Source: Home / Self Care | Attending: Internal Medicine

## 2019-01-06 DIAGNOSIS — I350 Nonrheumatic aortic (valve) stenosis: Secondary | ICD-10-CM

## 2019-01-06 DIAGNOSIS — S72142A Displaced intertrochanteric fracture of left femur, initial encounter for closed fracture: Principal | ICD-10-CM

## 2019-01-06 DIAGNOSIS — Z01810 Encounter for preprocedural cardiovascular examination: Secondary | ICD-10-CM

## 2019-01-06 DIAGNOSIS — R011 Cardiac murmur, unspecified: Secondary | ICD-10-CM

## 2019-01-06 DIAGNOSIS — I2510 Atherosclerotic heart disease of native coronary artery without angina pectoris: Secondary | ICD-10-CM

## 2019-01-06 DIAGNOSIS — I272 Pulmonary hypertension, unspecified: Secondary | ICD-10-CM

## 2019-01-06 DIAGNOSIS — S42202D Unspecified fracture of upper end of left humerus, subsequent encounter for fracture with routine healing: Secondary | ICD-10-CM

## 2019-01-06 LAB — CBC
HCT: 25.4 % — ABNORMAL LOW (ref 36.0–46.0)
Hemoglobin: 8 g/dL — ABNORMAL LOW (ref 12.0–15.0)
MCH: 30.3 pg (ref 26.0–34.0)
MCHC: 31.5 g/dL (ref 30.0–36.0)
MCV: 96.2 fL (ref 80.0–100.0)
Platelets: 212 10*3/uL (ref 150–400)
RBC: 2.64 MIL/uL — ABNORMAL LOW (ref 3.87–5.11)
RDW: 13.8 % (ref 11.5–15.5)
WBC: 8.2 10*3/uL (ref 4.0–10.5)
nRBC: 0 % (ref 0.0–0.2)

## 2019-01-06 LAB — ECHOCARDIOGRAM COMPLETE
Height: 59 in
Weight: 1470.91 oz

## 2019-01-06 LAB — BASIC METABOLIC PANEL
Anion gap: 17 — ABNORMAL HIGH (ref 5–15)
BUN: 18 mg/dL (ref 8–23)
CO2: 18 mmol/L — ABNORMAL LOW (ref 22–32)
Calcium: 8.8 mg/dL — ABNORMAL LOW (ref 8.9–10.3)
Chloride: 107 mmol/L (ref 98–111)
Creatinine, Ser: 1.01 mg/dL — ABNORMAL HIGH (ref 0.44–1.00)
GFR calc Af Amer: 55 mL/min — ABNORMAL LOW (ref >60)
GFR calc non Af Amer: 48 mL/min — ABNORMAL LOW (ref >60)
Glucose, Bld: 103 mg/dL — ABNORMAL HIGH (ref 70–99)
Potassium: 4.8 mmol/L (ref 3.5–5.1)
Sodium: 142 mmol/L (ref 135–145)

## 2019-01-06 LAB — MRSA PCR SCREENING: MRSA by PCR: NEGATIVE

## 2019-01-06 LAB — PREPARE RBC (CROSSMATCH)

## 2019-01-06 SURGERY — FIXATION, FRACTURE, INTERTROCHANTERIC, WITH INTRAMEDULLARY ROD
Anesthesia: Choice | Laterality: Left

## 2019-01-06 MED ORDER — FENTANYL CITRATE (PF) 250 MCG/5ML IJ SOLN
INTRAMUSCULAR | Status: AC
  Filled 2019-01-06: qty 5

## 2019-01-06 MED ORDER — SODIUM CHLORIDE 0.9% IV SOLUTION
Freq: Once | INTRAVENOUS | Status: AC
  Administered 2019-01-06: 15:00:00 via INTRAVENOUS

## 2019-01-06 MED ORDER — CEFAZOLIN SODIUM-DEXTROSE 2-4 GM/100ML-% IV SOLN
INTRAVENOUS | Status: AC
  Filled 2019-01-06: qty 100

## 2019-01-06 NOTE — Progress Notes (Signed)
PROGRESS NOTE    Ann Garza  [id number] DOB: Sep 27, 1922 DOA: 01/05/2019 PCP: System, Provider Not In    Brief Narrative:  Ann Garza is a 85 y.o. female with medical history significant of falls, is brought to the hospital today after suffering a fall.  Her most recent fall was on 11/2 when she was evaluated at [facility] and was found to have a left humerus fracture.  Patient reports that she had no dizziness prior to her fall today.  She did not complain of chest pain or shortness of breath.  She did not lose consciousness.  She has not had any fever, cough.  Patient was evaluated in the emergency room where x-rays confirmed left-sided hip fracture.  Case was reviewed with orthopedics, Dr. Aline Brochure who felt that patient was too high risk from cardiac perspective to undergo surgery at [facility] and is recommended transfer to [facility].  EDP discussed case with Dr. Doreatha Martin on-call for orthopedics at [facility].    Patient transferred here early morning on 01/06/2019.  She is pending cardiology clearance for orthopedic surgery early this afternoon.  Assessment & Plan:   Principal Problem:   Closed left hip fracture ([place]) Active Problems:   Fall at home, initial encounter   Closed fracture of proximal end of left humerus   Unsp fracture of fifth metacarpal bone, left hand, init   Osteopenia   Hypokalemia  Left hip fracture -Ortho following -Cardiology consulted for clearance -Pain management per surgical team -She will need PT and OT postoperatively  AKI -We will need perioperative IV fluids and encourage p.o. intake postoperatively -Avoid nephrotoxic medications -Monitor urine output and daily creatinine  Coronary artery disease -Cardiology consulted for clearance  Frequent falls -PT and OT postoperatively -Case management and social work will need to be involved for safe placement and disposition eventually   DVT prophylaxis: Heparin SQ Code  Status:DNR   Consultants:   Orthopedic surgery  Cardiology  Procedures:   Pending surgery for left hip fracture  Antimicrobials:  None   Subjective: Patient is in mild pain but not much pain unless she moves, per patient.  Objective: Vitals:   01/06/19 0748 01/06/19 0753 01/06/19 0957 01/06/19 1139  BP: (!) 118/30 124/68  (!) 141/67  Pulse: 64   (!) 102  Resp: 16 16  16   Temp: 98.4 F (36.9 C) 98.2 F (36.8 C)  98.1 F (36.7 C)  TempSrc: Oral Oral  Oral  SpO2: 97% 98%  93%  Weight:   41.7 kg   Height:   4\' 11"  (1.499 m)     Intake/Output Summary (Last 24 hours) at 01/06/2019 1525 Last data filed at 01/06/2019 1300 Gross per 24 hour  Intake 240 ml  Output 100 ml  Net 140 ml   Filed Weights   01/05/19 1415 01/06/19 0957  Weight: 41.7 kg 41.7 kg    Examination:  General: Elderly frail Caucasian female resting comfortably in no acute distress. HEENT: Normocephalic and atraumatic. Sclera clear. EOMs intact. Neck: Supple.  Heart: RRR. Distinct S1 and S2.   Multiple systolic murmurs on exam.  No gallops or rubs. Radial and distal pedal pulses 2+ and equal bilaterally. Lungs: Clear to ausculation bilaterally. No wheezes, rhonchi, or rales.  Abdomen: Soft, non-distended, and non-tender to palpation. Bowel sounds present. MSK: Normal strength and tone for age. Extremities: No clubbing, cyanosis, or edema.    Tender to palpation in the left lateral hip region  Skin: Warm and  dry. Neuro: Alert and oriented x3. No focal deficits. Psych: Normal affect. Responds appropriately.    LOS: 1 day    Time spent: 35 minutes    Charolotte Capuchin, MD [facility] Pager [phone]  If 7PM-7AM, please contact night-coverage [email or web address] Password [email or web address] 01/06/2019, 3:25 PM

## 2019-01-06 NOTE — Progress Notes (Signed)
Brief cardiology progress note, update:  Echo read: Difficult images given inability to change positions. Overall preserved EF. Aortic valve is severely calcified. Moderate AS by gradients, severe AS by valve area (though valve area calculation is prone to overestimation). Small LV cavity with small stroke volume but normal EF. Tricuspid regurgitation not well visualized on this imaging, therefore cannot estimate RVSP. Mitral valve also with difficult angle on imaging, no significant MR captured but cannot be fully excluded on this imaging. TR and MR unlikely to be severe given lack of doppler signal.  She is intermediate risk for surgery, recommend close monitoring and avoiding large hemodynamic shifts if possible. She may be low flow low gradient AS given her small stroke volume (despite normal EF). Would avoid preload reducing agents given aortic stenosis.  Buford Dresser, MD, PhD [facility]  [address] [place], [place] [address] [phone]

## 2019-01-06 NOTE — Plan of Care (Signed)
  Problem: Activity: Goal: Ability to ambulate and perform ADLs will improve 01/06/2019 2307 by Junius Roads, RN Outcome: Progressing 01/06/2019 2306 by Junius Roads, RN Outcome: Progressing   Problem: Clinical Measurements: Goal: Ability to maintain clinical measurements within normal limits will improve Outcome: Progressing   Problem: Nutrition: Goal: Adequate nutrition will be maintained Outcome: Progressing   Problem: Coping: Goal: Level of anxiety will decrease Outcome: Progressing   Problem: Pain Managment: Goal: General experience of comfort will improve Outcome: Progressing   Problem: Safety: Goal: Ability to remain free from injury will improve Outcome: Progressing

## 2019-01-06 NOTE — Consult Note (Signed)
Reason for Consult:Left hip fx Referring Physician: [name] is an 85 y.o. female.  HPI: Ann Garza fell at home yesterday. She has Ann history of frequent falls and fell recently breaking her left humerus. She seemed to get lightheaded and then fell onto her left side. Her son tried to catch her but did not get there in time. She was taken to [facility] where x-rays showed Ann left hip fx. Orthopedics there was uncomfortable taking her to the OR given her cardiac history and requested transfer to [facility] for clearance and definitive care. She c/o left hip pain. She is very HoH and her left ear is the better one.  Past Medical History:  Diagnosis Date  . Anemia   . Cancer ([place])    non melenoma skin/ Colon  . Coronary artery disease    cath 06/15/11 EF55%  . Elevated glucose   . Hearing loss   . Heart murmur    suspect aortic stenosis  . Hypercalcemia   . Hypertension   . MI, old   . Osteoporosis   . Wedge compression fracture of T8 vertebra [facility])     Past Surgical History:  Procedure Laterality Date  . COLON SURGERY    . FRACTURE SURGERY     arm    No family history on file.  Social History:  reports that she has never smoked. She does not have any smokeless tobacco history on file. She reports previous alcohol use. She reports that she does not use drugs.  Allergies: No Known Allergies  Medications: I have reviewed the patient's current medications.  Results for orders placed or performed during the hospital encounter of 01/05/19 (from the past 48 hour(s))  Urinalysis, Routine w reflex microscopic     Status: Abnormal   Collection Time: 01/05/19 11:28 AM  Result Value Ref Range   Color, Urine YELLOW YELLOW   APPearance HAZY (Ann) CLEAR   Specific Gravity, Urine 1.017 1.005 - 1.030   pH 5.0 5.0 - 8.0   Glucose, UA NEGATIVE NEGATIVE mg/dL   Hgb urine dipstick NEGATIVE NEGATIVE   Bilirubin Urine NEGATIVE NEGATIVE   Ketones, ur 20 (Ann) NEGATIVE mg/dL   Protein, ur NEGATIVE  NEGATIVE mg/dL   Nitrite NEGATIVE NEGATIVE   Leukocytes,Ua NEGATIVE NEGATIVE    Comment: Performed at [facility], [address] [place], [place] [address]  SARS CORONAVIRUS 2 (TAT 6-24 HRS) Nasopharyngeal Nasopharyngeal Swab     Status: None   Collection Time: 01/05/19 11:37 AM   Specimen: Nasopharyngeal Swab  Result Value Ref Range   SARS Coronavirus 2 NEGATIVE NEGATIVE    Comment: (NOTE) SARS-CoV-2 target nucleic acids are NOT DETECTED. The SARS-CoV-2 RNA is generally detectable in upper and lower respiratory specimens during the acute phase of infection. Negative results do not preclude SARS-CoV-2 infection, do not rule out co-infections with other pathogens, and should not be used as the sole basis for treatment or other patient management decisions. Negative results must be combined with clinical observations, patient history, and epidemiological information. The expected result is Negative. Fact Sheet for Patients: SugarRoll.be Fact Sheet for Healthcare Providers: https://www.woods-mathews.com/ This test is not yet approved or cleared by the [place] FDA and  has been authorized for detection and/or diagnosis of SARS-CoV-2 by FDA under an Emergency Use Authorization (EUA). This EUA will remain  in effect (meaning this test can be used) for the duration of the COVID-19 declaration under Section 56 4(b)(1) of the Act, 21 U.S.C. section 360bbb-3(b)(1),  unless the authorization is terminated or revoked sooner. Performed at [facility], [place] [address], [place], [place] [id number]   Basic metabolic panel     Status: Abnormal   Collection Time: 01/05/19 11:55 AM  Result Value Ref Range   Sodium 145 135 - 145 mmol/L   Potassium 3.0 (L) 3.5 - 5.1 mmol/L   Chloride 108 98 - 111 mmol/L   CO2 28 22 - 32 mmol/L   Glucose, Bld 135 (H) 70 - 99 mg/dL   BUN 13 8 - 23 mg/dL   Creatinine, Ser 0.58 0.44 - 1.00 mg/dL   Calcium 9.2 8.9  - 10.3 mg/dL   GFR calc non Af Amer >60 >60 mL/min   GFR calc Af Amer >60 >60 mL/min   Anion gap 9 5 - 15    Comment: Performed at [facility], [address], [place], [place] [address]  CBC WITH DIFFERENTIAL     Status: Abnormal   Collection Time: 01/05/19 11:55 AM  Result Value Ref Range   WBC 8.9 4.0 - 10.5 K/uL   RBC 3.40 (L) 3.87 - 5.11 MIL/uL   Hemoglobin 10.2 (L) 12.0 - 15.0 g/dL   HCT 33.0 (L) 36.0 - 46.0 %   MCV 97.1 80.0 - 100.0 fL   MCH 30.0 26.0 - 34.0 pg   MCHC 30.9 30.0 - 36.0 g/dL   RDW 13.5 11.5 - 15.5 %   Platelets 244 150 - 400 K/uL   nRBC 0.0 0.0 - 0.2 %   Neutrophils Relative % 88 %   Neutro Abs 7.8 (H) 1.7 - 7.7 K/uL   Lymphocytes Relative 5 %   Lymphs Abs 0.4 (L) 0.7 - 4.0 K/uL   Monocytes Relative 6 %   Monocytes Absolute 0.6 0.1 - 1.0 K/uL   Eosinophils Relative 1 %   Eosinophils Absolute 0.0 0.0 - 0.5 K/uL   Basophils Relative 0 %   Basophils Absolute 0.0 0.0 - 0.1 K/uL   Immature Granulocytes 0 %   Abs Immature Granulocytes 0.03 0.00 - 0.07 K/uL    Comment: Performed at [facility], [address] [place], [place] [address]  Protime-INR     Status: None   Collection Time: 01/05/19 11:55 AM  Result Value Ref Range   Prothrombin Time 13.6 11.4 - 15.2 seconds   INR 1.1 0.8 - 1.2    Comment: (NOTE) INR goal varies based on device and disease states. Performed at [facility], [address], [place], [place] [address]   Type and screen [facility]     Status: None   Collection Time: 01/05/19 11:55 AM  Result Value Ref Range   ABO/RH(D)      Jenetta Downer NEG Performed at [place] [place], [place] [address]    Antibody Screen      POS Performed at [facility], [address], [place], [place] [address]    Sample Expiration      01/08/2019,2359 Performed at [facility], [address] [place], [place] [address]    Antibody Identification      ANTI D Performed at [facility], [place] [address]  [place],  [address]   Troponin I (High Sensitivity)     Status: None   Collection Time: 01/05/19  3:00 PM  Result Value Ref Range   Troponin I (High Sensitivity) 8 <18 ng/L    Comment: (NOTE) Elevated high sensitivity troponin I (hsTnI) values and significant  changes across serial measurements  may suggest ACS but many other  chronic and acute conditions are known to elevate hsTnI results.  Refer to the "Links" section for chest pain algorithms and additional  guidance. Performed at [facility], [address] [place], [place] [address]   Magnesium     Status: None   Collection Time: 01/05/19  3:00 PM  Result Value Ref Range   Magnesium 1.9 1.7 - 2.4 mg/dL    Comment: Performed at [facility], [address] [place], [place] [address]  Troponin I (High Sensitivity)     Status: None   Collection Time: 01/05/19  5:52 PM  Result Value Ref Range   Troponin I (High Sensitivity) 8 <18 ng/L    Comment: (NOTE) Elevated high sensitivity troponin I (hsTnI) values and significant  changes across serial measurements may suggest ACS but many other  chronic and acute conditions are known to elevate hsTnI results.  Refer to the "Links" section for chest pain algorithms and additional  guidance. Performed at [facility], [address], [place], [place] [address]   MRSA PCR Screening     Status: None   Collection Time: 01/06/19  6:33 AM   Specimen: Nasal Mucosa; Nasopharyngeal  Result Value Ref Range   MRSA by PCR NEGATIVE NEGATIVE    Comment:        The GeneXpert MRSA Assay (FDA approved for NASAL specimens only), is one component of Ann comprehensive MRSA colonization surveillance program. It is not intended to diagnose MRSA infection nor to guide or monitor treatment for MRSA infections. Performed at [facility], [place] [address] [place],  [address]     Dg Chest 1 View  Result Date: 01/05/2019 CLINICAL DATA:  85 year old female with history of trauma from Ann fall  today. EXAM: CHEST  1 VIEW COMPARISON:  No priors. FINDINGS: Lung volumes are low. Mild elevation of the right hemidiaphragm, of uncertain chronicity. No acute consolidative airspace disease. No pneumothorax. No definite suspicious appearing pulmonary nodules or masses are noted. No pleural effusions. No evidence of pulmonary edema. Heart size is mildly enlarged. Upper mediastinal contours are within normal limits. Aortic atherosclerosis. Visualized bony thorax appears grossly intact. IMPRESSION: 1. Low lung volumes without radiographic evidence of acute cardiopulmonary disease. 2. Aortic atherosclerosis. 3. Mild cardiomegaly. Electronically Signed   By: Vinnie Langton M.D.   On: 01/05/2019 12:59   Ct Head Wo Contrast  Result Date: 01/05/2019 CLINICAL DATA:  85 year old female with fall. EXAM: CT HEAD WITHOUT CONTRAST TECHNIQUE: Contiguous axial images were obtained from the base of the skull through the vertex without intravenous contrast. COMPARISON:  None. FINDINGS: Brain: There is mild age-related atrophy and chronic microvascular ischemic changes. There is no acute intracranial hemorrhage. No mass effect or midline shift. No extra-axial fluid collection. Vascular: No hyperdense vessel or unexpected calcification. Skull: Normal. Negative for fracture or focal lesion. Sinuses/Orbits: No acute finding. Other: Left posterior parietal scalp contusion. IMPRESSION: 1. No acute intracranial hemorrhage. 2. Mild age-related atrophy and chronic microvascular ischemic changes. Electronically Signed   By: Anner Crete M.D.   On: 01/05/2019 12:53   Dg Humerus Left  Result Date: 01/05/2019 CLINICAL DATA:  Fall, pre-existing fracture EXAM: LEFT HUMERUS - 2+ VIEW COMPARISON:  None. FINDINGS: Osteopenia. There is an impacted fracture of the surgical neck of the left humerus, which appears to have some evidence of resorption about the fracture fragments and may be subacute. No other fracture or dislocation of the  left humerus. Soft tissues are unremarkable. IMPRESSION:  1. Osteopenia. 2. There is an impacted fracture of the surgical neck of the left humerus, which appears to have some evidence of resorption about the fracture fragments and may be subacute. No other fracture or dislocation of the left humerus. Electronically Signed   By: Eddie Candle M.D.   On: 01/05/2019 14:29   Dg Hand Complete Left  Result Date: 01/05/2019 CLINICAL DATA:  Fall, pain EXAM: LEFT HAND - COMPLETE 3+ VIEW COMPARISON:  None. FINDINGS: Profound osteopenia. There is an angulated fracture of the distal left fifth metacarpal. 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 Intravenous catheter in the dorsum of the wrist. IMPRESSION: 1. Profound osteopenia. 2. There is an angulated fracture of the distal left fifth metacarpal. 3. 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 Electronically Signed   By: Eddie Candle M.D.   On: 01/05/2019 14:27   Dg Hip Unilat With Pelvis 2-3 Views Left  Result Date: 01/05/2019 CLINICAL DATA:  Patient fell today and has pain in the left hip with obvious shortening of the leg according to ems. She did have an injury to the femoral head in September, but the family reported no fracture. Hx HTN, CAD, nonsmoker EXAM: DG HIP (WITH OR WITHOUT PELVIS) 2-3V LEFT COMPARISON:  None. FINDINGS: There is Ann comminuted intertrochanteric fracture of the proximal left femur. The fracture is displaced, distal shaft fracture component displacing laterally by 2 cm. Lesser and greater trochanteric fracture components are also displaced. There is varus angulation. No other fractures.  No bone lesions. Hip joints, SI joints and symphysis pubis are normally aligned. There is soft tissue edema lateral to the fracture proximal left femur. IMPRESSION: 1. Comminuted, mildly displaced and varus angulated intertrochanteric fracture of the proximal left femur. No dislocation. Electronically Signed    By: Lajean Manes M.D.   On: 01/05/2019 12:59    Review of Systems  Constitutional: Negative for weight loss.  HENT: Negative for ear discharge, ear pain, hearing loss and tinnitus.   Eyes: Negative for blurred vision, double vision, photophobia and pain.  Respiratory: Negative for cough, sputum production and shortness of breath.   Cardiovascular: Negative for chest pain.  Gastrointestinal: Negative for abdominal pain, nausea and vomiting.  Genitourinary: Negative for dysuria, flank pain, frequency and urgency.  Musculoskeletal: Positive for falls and joint pain (Left hip, left arm). Negative for back pain, myalgias and neck pain.  Neurological: Negative for dizziness, tingling, sensory change, focal weakness, loss of consciousness and headaches.  Endo/Heme/Allergies: Does not bruise/bleed easily.  Psychiatric/Behavioral: Negative for depression, memory loss and substance abuse. The patient is not nervous/anxious.    Blood pressure 124/68, pulse 64, temperature 98.2 F (36.8 C), temperature source Oral, resp. rate 16, height 4\' 11"  (1.499 m), weight 41.7 kg, SpO2 98 %. Physical Exam  Constitutional: She appears well-developed and well-nourished. No distress.  HENT:  Head: Normocephalic and atraumatic.  Eyes: Conjunctivae are normal. Right eye exhibits no discharge. Left eye exhibits no discharge. No scleral icterus.  Neck: Normal range of motion.  Cardiovascular: Normal rate and regular rhythm.  Respiratory: Effort normal. No respiratory distress.  Musculoskeletal:     Comments: LLE No traumatic wounds, ecchymosis, or rash  Mod TTP hip  No knee or ankle effusion  Knee stable to varus/ valgus and anterior/posterior stress  Sens DPN, SPN, TN intact  Motor EHL, ext, flex, evers 5/5  DP 1+, PT 1+, No significant edema  Neurological: She is alert.  Skin: Skin is warm and dry.  She is not diaphoretic.  Psychiatric: She has Ann normal mood and affect. Her behavior is normal.     Assessment/Plan: Left hip fx -- Plan IMN today as long as we can get cardiac clearance. If not can push surgery to tomorrow. Continue NPO for now. Multiple medical problems including CAD, AS, hearing loss, HTN -- per primary service    Lisette Abu, PA-C Orthopedic Surgery [phone] 01/06/2019, 9:17 AM

## 2019-01-06 NOTE — Progress Notes (Signed)
Report given to Leanne in [place].

## 2019-01-06 NOTE — Progress Notes (Signed)
Patient's daughter-in-law Ann Garza called related to the status of patient and pending surgery.   Ann Garza self-identified as patient's son, Ann Garza, wife.  RN explained to daughter-in-law that patient is scheduled for surgery on today. RN also explained visitor and visitation policy.  Patient is resting comfortably in bed.   Daughter-in-law was pleasant and informed RN that patient's son who is also the POA will be the primary visitor.

## 2019-01-06 NOTE — Progress Notes (Signed)
Cardiology in with patient stating patient needed an echo prior to surgery. Surgery cancelled for today. Report called to floor RN. Verbalized understanding. Transported patient back to room with son at the bedside.

## 2019-01-06 NOTE — Anesthesia Preprocedure Evaluation (Addendum)
Anesthesia Evaluation  Patient identified by MRN, date of birth, ID band Patient confused    Reviewed: Allergy & Precautions, NPO status , Patient's Chart, lab work & pertinent test results  History of Anesthesia Complications Negative for: history of anesthetic complications  Airway Mallampati: III   Neck ROM: Limited  Mouth opening: Limited Mouth Opening  Dental  (+) Dental Advisory Given   Pulmonary neg pulmonary ROS,    Pulmonary exam normal        Cardiovascular hypertension, + CAD and + Past MI  + Valvular Problems/Murmurs AS  Rhythm:Regular Rate:Normal + Systolic murmurs  '20 TTE - EF 65 to 70%. Grade I diastolic dysfunction (impaired relaxation). Moderate AS. Doppler beam alignment was suboptimal.  '13 Cath - 1. Very mild nonobstructive two-vessel coronary artery disease. 2. Regional wall motion abnormality with overall preserved left ventricular systolic function most consistent with apical ballooning syndrome. 3. Trace-to-mild mitral insufficiency. 4. Normal hemodynamics.   Neuro/Psych negative neurological ROS  negative psych ROS   GI/Hepatic negative GI ROS, Neg liver ROS,   Endo/Other  negative endocrine ROS  Renal/GU negative Renal ROS     Musculoskeletal negative musculoskeletal ROS (+)   Abdominal   Peds  Hematology  (+) anemia ,   Anesthesia Other Findings   Reproductive/Obstetrics                          Anesthesia Physical Anesthesia Plan  ASA: IV  Anesthesia Plan: General   Post-op Pain Management:    Induction: Intravenous  PONV Risk Score and Plan: 3 and Treatment may vary due to age or medical condition, Ondansetron and Propofol infusion  Airway Management Planned: LMA  Additional Equipment: Arterial line  Intra-op Plan:   Post-operative Plan: Extubation in OR  Informed Consent: I have reviewed the patients History and Physical, chart, labs and  discussed the procedure including the risks, benefits and alternatives for the proposed anesthesia with the patient or authorized representative who has indicated his/her understanding and acceptance.   Patient has DNR.  Discussed DNR with power of attorney and Suspend DNR.   Dental advisory given and Consent reviewed with POA  Plan Discussed with: CRNA and Anesthesiologist  Anesthesia Plan Comments: (Discussed anesthesia plan at length with patient's son and daughter in law via telephone. Family in agreement with anesthesia plan. As per DNR, it is suspended for the procedure. Family wishes medical team to use best judgement in regards to implementing chest compressions given patient's age and frailty. )      Anesthesia Quick Evaluation

## 2019-01-06 NOTE — Anesthesia Preprocedure Evaluation (Deleted)
Anesthesia Evaluation  Patient identified by MRN, date of birth, ID band Patient awake    Reviewed: Allergy & Precautions, NPO status , Patient's Chart, lab work & pertinent test results  Airway        Dental   Pulmonary neg pulmonary ROS,           Cardiovascular hypertension, + CAD and + Past MI    Hx old MI   Last cath at [facility] 2013: EF55%   Neuro/Psych negative neurological ROS  negative psych ROS   GI/Hepatic negative GI ROS, Neg liver ROS,   Endo/Other  negative endocrine ROS  Renal/GU negative Renal ROS  negative genitourinary   Musculoskeletal negative musculoskeletal ROS (+) T8 compression fx   Abdominal   Peds negative pediatric ROS (+)  Hematology  (+) anemia , hct 33, plt 244  Hx skin ca, CRC   Anesthesia Other Findings [place] 11/2- L humerus fx, left hip fx, left 5th metacarpal fx  Reproductive/Obstetrics negative OB ROS                             Anesthesia Physical Anesthesia Plan Anesthesia Quick Evaluation

## 2019-01-06 NOTE — Consult Note (Signed)
Cardiology Consultation:   Patient ID: Ann Garza MRN: [id number]; DOB: Jul 13, 1922  Admit date: 01/05/2019 Date of Consult: 01/06/2019  Primary Care Provider: System, Provider Not In Primary Cardiologist: No primary care provider on file.  Primary Electrophysiologist:  None    Patient Profile:   Ann Garza is a 85 y.o. female with a history of CAD with very mild 2 vessel CAD on cardiac cath in 2013 at [facility], mild to moderate tricuspid regurgitation on Echo in 2013, hypertension, anemia, and hearing loss who is being seen today for pre-op evaluation for left hip fracture at the request of Dr. Theresa Duty.  History of Present Illness:   Ann Garza is a 85 year old female with the above history.  Patient extremely hard of hearing so son who helped give history.  Son reports that patient had a mild heart attack in 2013 for which she was treated at [facility] in [place].  Per chart review in care everywhere cardiac catheterization at that time showed very mild nonobstructive two-vessel CAD and some regional wall motion abnormalities with overall preserved LV function most consistent with apical ballooning syndrome.  Echo at that time showed LVEF of 45 to 50% with mild LVH, mild to moderate TR, trace MR, elevated RVSP of 50 to 60 mm per mercury consistent with moderately severe pulmonary hypertension.  Son reports that patient has not seen a cardiologist since this time.  Patient has had multiple mechanical falls recently.  She fell down concrete steps outside on 10/29 and fractured her left humerus.  She was evaluated at [place] any cardiac symptoms prior to fall at that time.  Prior to this event, she was living alone.  However after discharge, she moved in with son and daughter-in-law and received home PT.  Patient presented ED yesterday following another mechanical fall and was found to have intertrochanteric fracture of the proximal left femur.  Son  describes the event as patient being unsteady on her feet.  Son tried to catch the patient but her feet slipped out from underneath her and she fell hitting the back of her head and landing on her left hip.  Patient denies any cardiac symptoms prior to the fall including chest pain, shortness of breath, palpitations, lightheadedness, dizziness.  No syncope.  No active CHF symptoms.  Cardiology was consulted for preop evaluation.  At the time of this evaluation, patient resting comfortably in preop area.  She reports some mild left hip pain at this time but otherwise has no complaints.  Patient has remote smoking history but but son reports she has not smoked in many years.  No known family history of cardiovascular disease her son.  Heart Pathway Score:     Past Medical History:  Diagnosis Date   Anemia    Cancer ([place])    non melenoma skin/ Colon   Coronary artery disease    cath 06/15/11 EF55%   Elevated glucose    Hearing loss    Heart murmur    suspect aortic stenosis   Hypercalcemia    Hypertension    MI, old    Osteoporosis    Wedge compression fracture of T8 vertebra (HCC)     Past Surgical History:  Procedure Laterality Date   COLON SURGERY     FRACTURE SURGERY     arm     Home Medications:  Prior to Admission medications   Medication Sig Start Date End Date Taking? Authorizing Provider  Multiple Vitamin (MULTIVITAMIN  WITH MINERALS) TABS tablet Take 1 tablet by mouth daily.   Yes [provider]  mupirocin ointment (BACTROBAN) 2 % Apply 1 application topically 3 (three) times daily.  01/03/19 01/03/20 Yes [provider]  [place] See admin instructions. Nystatin-Hydrocortisone-Zinc Oxide-Lanolin-dermabase (1:1:1:1:1)  - Apply topically twice daily   Yes [provider]  fluconazole (DIFLUCAN) 150 MG tablet Take by mouth. 01/03/19 01/06/19  [provider]    Inpatient Medications: Scheduled Meds:   [MAR Hold] heparin  5,000 Units Subcutaneous Q8H   Continuous Infusions:   ceFAZolin (ANCEF) IV     PRN Meds: [MAR Hold] HYDROcodone-acetaminophen, [MAR Hold]  morphine injection  Allergies:   No Known Allergies  Social History:   Social History   Socioeconomic History   Marital status: Widowed    Spouse name: Not on file   Number of children: Not on file   Years of education: Not on file   Highest education level: Not on file  Occupational History   Not on file  Social Needs   Financial resource strain: Not on file   Food insecurity    Worry: Not on file    Inability: Not on file   Transportation needs    Medical: Not on file    Non-medical: Not on file  Tobacco Use   Smoking status: Never Smoker  Substance and Sexual Activity   Alcohol use: Not Currently   Drug use: Never   Sexual activity: Not Currently  Lifestyle   Physical activity    Days per week: Not on file    Minutes per session: Not on file   Stress: Not on file  Relationships   Social connections    Talks on phone: Not on file    Gets together: Not on file    Attends religious service: Not on file    Active member of club or organization: Not on file    Attends meetings of clubs or organizations: Not on file    Relationship status: Not on file   Intimate partner violence    Fear of current or ex partner: Not on file    Emotionally abused: Not on file    Physically abused: Not on file    Forced sexual activity: Not on file  Other Topics Concern   Not on file  Social History Narrative   Not on file    Family History:   History reviewed. No pertinent family history.   ROS:  Please see the history of present illness.  All other ROS reviewed and negative.     Physical Exam/Data:   Vitals:   01/06/19 0442 01/06/19 0748 01/06/19 0753 01/06/19 0957  BP: (!) 155/66 (!) 118/30 124/68   Pulse: (!) 101 64    Resp:  16 16   Temp: 98.3 F (36.8 C) 98.4 F (36.9 C) 98.2 F  (36.8 C)   TempSrc: Oral Oral Oral   SpO2: 98% 97% 98%   Weight:    41.7 kg  Height:    4\' 11"  (1.499 m)   No intake or output data in the 24 hours ending 01/06/19 1011 Last 3 Weights 01/06/2019 01/05/2019  Weight (lbs) 91 lb 14.9 oz 92 lb  Weight (kg) 41.7 kg 41.731 kg     Body mass index is 18.57 kg/m.  General: Elderly frail Caucasian female resting comfortably in no acute distress. HEENT: Normocephalic and atraumatic. Sclera clear. EOMs intact. Neck: Supple.  Heart: RRR. Distinct S1 and S2. Multiple  murmurs noted on exam. II/VI systolic murmur noted at right upper sternal border. Another III/VI systolic murmur noted at lower sternal border and apex. No gallops or rubs. Radial and distal pedal pulses 2+ and equal bilaterally. Lungs: No increased work of breathing. Clear to ausculation bilaterally. No wheezes, rhonchi, or rales.  Abdomen: Soft, non-distended, and non-tender to palpation. Bowel sounds present. MSK: Normal strength and tone for age. Extremities: No clubbing, cyanosis, or edema.    Skin: Warm and dry. Neuro: Alert and oriented x3. No focal deficits. Psych: Normal affect. Responds appropriately.   EKG:  The EKG was personally reviewed and demonstrates:  Normal sinus rhythm, rate 76 bpm, with PAC and non-specific ST/T changes.  Telemetry:  Telemetry was personally reviewed and demonstrates:  Patient not on telemetry.  Relevant CV Studies:  Cardiac Catheterization 06/16/2011 ([facility]): Interpretation: CORONARY ANGIOGRAPHY: Left main is normal. Left anterior descending artery has minor irregularities. There is a moderate sized diagonal branch with some 20% ostial disease. The circumflex is a normal vessel with small first and second obtuse marginal branch that was normal. The right coronary artery was a large dominant vessel with some mild irregularities up to 25% in the mid section. It bifurcates to a large posterior descending artery and posterolateral  ventricular branch, both of which are normal.  CONCLUSION: 1. Very mild nonobstructive two-vessel coronary artery disease. 2. Regional wall motion abnormality with overall preserved left ventricular systolic function most consistent with apical ballooning syndrome. 3. Trace-to-mild mitral insufficiency. 4. Normal hemodynamics. _______________  Echocardiogram 06/15/2011 ([facility]): Summary: A portable transthoracic echocardiogram with 2-D, color flow, and Doppler was performed. The left ventricle is mildly dilated. There is mild concentric left ventricular hypertrophy. The left ventricular ejection fraction is mildly reduced (45-50%). Transmitral Doppler flow pattern is normal for age. The right ventricle is grossly normal in size and function. The left atrium is mildly dilated. The right atrium is normal. The mitral valve is normal in structure with trace regurgitation. There is mild to moderate (1-2+) tricuspid regurgitation. Right ventricular systolic pressure is elevated between 50-20mm Hg, consistent with moderately severe pulmonary hypertension. The aortic valve is a normal trileaflet valve with no regurgitation. Left Ventricle The left ventricle is mildly dilated. There is mild concentric left ventricular hypertrophy. The left ventricular ejection fraction is mildly reduced (45-50%). Only the base of heart contracts, suggesting myopathy. Transmitral Doppler flow pattern is normal for age. Right Ventricle The right ventricle is grossly normal in size and function. Atria The left atrium is mildly dilated. The right atrium is normal. Mitral Valve The mitral valve is normal in structure with trace regurgitation. Tricuspid Valve The tricuspid valve is grossly normal. There is mild to moderate (1-2+) tricuspid regurgitation.Right ventricular systolic pressure is elevated between 50-82mm Hg, consistent with moderately severe pulmonary hypertension. Aortic Valve The aortic  valve is a normal trileaflet valve with no regurgitation. Pulmonic Valve The pulmonic valve is not well visualized. Vessels The aortic root is not well visualized, but is probably normal. Pericardium There is no pericardial effusion.  Laboratory Data:  High Sensitivity Troponin:   Recent Labs  Lab 01/05/19 1500 01/05/19 1752  TROPONINIHS 8 8     Chemistry Recent Labs  Lab 01/05/19 1155  NA 145  K 3.0*  CL 108  CO2 28  GLUCOSE 135*  BUN 13  CREATININE 0.58  CALCIUM 9.2  GFRNONAA >60  GFRAA >60  ANIONGAP 9    No results for input(s): PROT, ALBUMIN, AST, ALT, ALKPHOS, BILITOT in the last  168 hours. Hematology Recent Labs  Lab 01/05/19 1155  WBC 8.9  RBC 3.40*  HGB 10.2*  HCT 33.0*  MCV 97.1  MCH 30.0  MCHC 30.9  RDW 13.5  PLT 244   BNPNo results for input(s): BNP, PROBNP in the last 168 hours.  DDimer No results for input(s): DDIMER in the last 168 hours.   Radiology/Studies:  Dg Chest 1 View  Result Date: 01/05/2019 CLINICAL DATA:  85 year old female with history of trauma from a fall today. EXAM: CHEST  1 VIEW COMPARISON:  No priors. FINDINGS: Lung volumes are low. Mild elevation of the right hemidiaphragm, of uncertain chronicity. No acute consolidative airspace disease. No pneumothorax. No definite suspicious appearing pulmonary nodules or masses are noted. No pleural effusions. No evidence of pulmonary edema. Heart size is mildly enlarged. Upper mediastinal contours are within normal limits. Aortic atherosclerosis. Visualized bony thorax appears grossly intact. IMPRESSION: 1. Low lung volumes without radiographic evidence of acute cardiopulmonary disease. 2. Aortic atherosclerosis. 3. Mild cardiomegaly. Electronically Signed   By: Vinnie Langton M.D.   On: 01/05/2019 12:59   Ct Head Wo Contrast  Result Date: 01/05/2019 CLINICAL DATA:  85 year old female with fall. EXAM: CT HEAD WITHOUT CONTRAST TECHNIQUE: Contiguous axial images were obtained from the  base of the skull through the vertex without intravenous contrast. COMPARISON:  None. FINDINGS: Brain: There is mild age-related atrophy and chronic microvascular ischemic changes. There is no acute intracranial hemorrhage. No mass effect or midline shift. No extra-axial fluid collection. Vascular: No hyperdense vessel or unexpected calcification. Skull: Normal. Negative for fracture or focal lesion. Sinuses/Orbits: No acute finding. Other: Left posterior parietal scalp contusion. IMPRESSION: 1. No acute intracranial hemorrhage. 2. Mild age-related atrophy and chronic microvascular ischemic changes. Electronically Signed   By: Anner Crete M.D.   On: 01/05/2019 12:53   Dg Humerus Left  Result Date: 01/05/2019 CLINICAL DATA:  Fall, pre-existing fracture EXAM: LEFT HUMERUS - 2+ VIEW COMPARISON:  None. FINDINGS: Osteopenia. There is an impacted fracture of the surgical neck of the left humerus, which appears to have some evidence of resorption about the fracture fragments and may be subacute. No other fracture or dislocation of the left humerus. Soft tissues are unremarkable. IMPRESSION: 1. Osteopenia. 2. There is an impacted fracture of the surgical neck of the left humerus, which appears to have some evidence of resorption about the fracture fragments and may be subacute. No other fracture or dislocation of the left humerus. Electronically Signed   By: Eddie Candle M.D.   On: 01/05/2019 14:29   Dg Hand Complete Left  Result Date: 01/05/2019 CLINICAL DATA:  Fall, pain EXAM: LEFT HAND - COMPLETE 3+ VIEW COMPARISON:  None. FINDINGS: Profound osteopenia. There is an angulated fracture of the distal left fifth metacarpal. 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 Intravenous catheter in the dorsum of the wrist. IMPRESSION: 1. Profound osteopenia. 2. There is an angulated fracture of the distal left fifth metacarpal. 3. Moderate osteoarthritic pattern arthrosis about the hand and  wrist. Electronically Signed   By: Eddie Candle M.D.   On: 01/05/2019 14:27   Dg Hip Unilat With Pelvis 2-3 Views Left  Result Date: 01/05/2019 CLINICAL DATA:  Patient fell today and has pain in the left hip with obvious shortening of the leg according to ems. She did have an injury to the femoral head in September, but the family reported no fracture. Hx HTN, CAD, nonsmoker EXAM: DG HIP (WITH OR WITHOUT PELVIS) 2-3V LEFT COMPARISON:  None. FINDINGS: There is a comminuted intertrochanteric fracture of the proximal left femur. The fracture is displaced, distal shaft fracture component displacing laterally by 2 cm. Lesser and greater trochanteric fracture components are also displaced. There is varus angulation. No other fractures.  No bone lesions. Hip joints, SI joints and symphysis pubis are normally aligned. There is soft tissue edema lateral to the fracture proximal left femur. IMPRESSION: 1. Comminuted, mildly displaced and varus angulated intertrochanteric fracture of the proximal left femur. No dislocation. Electronically Signed   By: Lajean Manes M.D.   On: 01/05/2019 12:59    Assessment and Plan:   Pre-Op Evaluation Patient presented following a mechanical fall and was found to have left hip fracture.  Cardiology was consulted for preop evaluation.  Patient has not had any cardiac evaluation since 2013 at which time cardiac catheterization showed mild CAD and echo showed LVEF of 45-50% mild to moderate TR, trace MR, and moderately severe pulmonary hypertension with RVSP of 50-60 mmHg.  Prior to recent falls, patient living alone.  Patient denies any recent cardiac symptoms.  She does have multiple murmurs noted on exam though.  Ideally, she would have repeat echo prior to surgery for further evaluation.  However, patient currently in preop area and Ortho is waiting to take her make to surgery.  Given age, patient is at high risk procedure.  MD to follow-up.  Hypokalemia - Potassium 3.0  yesterday. She was repleted with 2 dose of K-Dur 40 mEq. - BMET today has not been drawn.  Otherwise, per primary team.   For questions or updates, please contact [place] Please consult [email or web address] for contact info under     Signed, Darreld Mclean, PA-C  01/06/2019 10:11 AM

## 2019-01-06 NOTE — Progress Notes (Signed)
  Echocardiogram 2D Echocardiogram has been performed.  Darlina Sicilian M 01/06/2019, 12:37 PM

## 2019-01-07 ENCOUNTER — Encounter (HOSPITAL_COMMUNITY): Payer: Self-pay | Admitting: Emergency Medicine

## 2019-01-07 ENCOUNTER — Inpatient Hospital Stay (HOSPITAL_COMMUNITY): Payer: Medicare Other

## 2019-01-07 ENCOUNTER — Encounter (HOSPITAL_COMMUNITY)
Admission: EM | Disposition: A | Discharge status: Skilled Nursing Facility | Payer: Self-pay | Source: Home / Self Care | Attending: Internal Medicine

## 2019-01-07 ENCOUNTER — Inpatient Hospital Stay (HOSPITAL_COMMUNITY): Payer: Medicare Other | Admitting: Anesthesiology

## 2019-01-07 DIAGNOSIS — S72002A Fracture of unspecified part of neck of left femur, initial encounter for closed fracture: Secondary | ICD-10-CM

## 2019-01-07 HISTORY — PX: INTRAMEDULLARY (IM) NAIL INTERTROCHANTERIC: SHX5875

## 2019-01-07 LAB — HEMOGLOBIN AND HEMATOCRIT, BLOOD
HCT: 33 % — ABNORMAL LOW (ref 36.0–46.0)
Hemoglobin: 11.2 g/dL — ABNORMAL LOW (ref 12.0–15.0)

## 2019-01-07 SURGERY — FIXATION, FRACTURE, INTERTROCHANTERIC, WITH INTRAMEDULLARY ROD
Anesthesia: General | Site: Hip | Laterality: Left

## 2019-01-07 MED ORDER — CEFAZOLIN SODIUM-DEXTROSE 2-4 GM/100ML-% IV SOLN
INTRAVENOUS | Status: AC
  Filled 2019-01-07: qty 100

## 2019-01-07 MED ORDER — PHENYLEPHRINE HCL-NACL 10-0.9 MG/250ML-% IV SOLN
INTRAVENOUS | Status: DC | PRN
  Administered 2019-01-07: 25 ug/min via INTRAVENOUS

## 2019-01-07 MED ORDER — ACETAMINOPHEN 10 MG/ML IV SOLN
INTRAVENOUS | Status: AC
  Filled 2019-01-07: qty 100

## 2019-01-07 MED ORDER — OXYCODONE HCL 5 MG/5ML PO SOLN: 5.0000 mg | Freq: Once | ORAL | Status: DC | PRN

## 2019-01-07 MED ORDER — ONDANSETRON HCL 4 MG PO TABS: 4.0000 mg | ORAL_TABLET | Freq: Four times a day (QID) | ORAL | Status: DC | PRN

## 2019-01-07 MED ORDER — PHENYLEPHRINE 40 MCG/ML (10ML) SYRINGE FOR IV PUSH (FOR BLOOD PRESSURE SUPPORT)
PREFILLED_SYRINGE | INTRAVENOUS | Status: DC | PRN
  Administered 2019-01-07: 120 ug via INTRAVENOUS
  Administered 2019-01-07: 80 ug via INTRAVENOUS
  Administered 2019-01-07: 40 ug via INTRAVENOUS
  Administered 2019-01-07: 80 ug via INTRAVENOUS

## 2019-01-07 MED ORDER — OXYCODONE HCL 5 MG PO TABS: 5.0000 mg | ORAL_TABLET | Freq: Once | ORAL | Status: DC | PRN

## 2019-01-07 MED ORDER — CEFAZOLIN SODIUM-DEXTROSE 2-4 GM/100ML-% IV SOLN
2.0000 g | Freq: Four times a day (QID) | INTRAVENOUS | Status: AC
  Administered 2019-01-07 (×2): 2 g via INTRAVENOUS
  Filled 2019-01-07 (×2): qty 100

## 2019-01-07 MED ORDER — SODIUM CHLORIDE 0.9 % IV SOLN
INTRAVENOUS | Status: DC
  Administered 2019-01-07: 15:00:00 via INTRAVENOUS

## 2019-01-07 MED ORDER — TRANEXAMIC ACID-NACL 1000-0.7 MG/100ML-% IV SOLN
INTRAVENOUS | Status: AC
  Filled 2019-01-07: qty 100

## 2019-01-07 MED ORDER — PHENOL 1.4 % MT LIQD: 1.0000 | OROMUCOSAL | Status: DC | PRN

## 2019-01-07 MED ORDER — FENTANYL CITRATE (PF) 100 MCG/2ML IJ SOLN: 25.0000 ug | INTRAMUSCULAR | Status: DC | PRN

## 2019-01-07 MED ORDER — LIDOCAINE 2% (20 MG/ML) 5 ML SYRINGE
INTRAMUSCULAR | Status: DC | PRN
  Administered 2019-01-07: 40 mg via INTRAVENOUS

## 2019-01-07 MED ORDER — HALOPERIDOL LACTATE 5 MG/ML IJ SOLN
1.0000 mg | Freq: Four times a day (QID) | INTRAMUSCULAR | Status: DC | PRN
  Administered 2019-01-07 – 2019-01-08 (×3): 1 mg via INTRAMUSCULAR
  Filled 2019-01-07 (×3): qty 1

## 2019-01-07 MED ORDER — METOCLOPRAMIDE HCL 5 MG/ML IJ SOLN: 5.0000 mg | Freq: Three times a day (TID) | INTRAMUSCULAR | Status: DC | PRN

## 2019-01-07 MED ORDER — ACETAMINOPHEN 10 MG/ML IV SOLN
500.0000 mg | Freq: Once | INTRAVENOUS | Status: AC
  Administered 2019-01-07: 500 mg via INTRAVENOUS

## 2019-01-07 MED ORDER — DOCUSATE SODIUM 100 MG PO CAPS
100.0000 mg | ORAL_CAPSULE | Freq: Two times a day (BID) | ORAL | Status: DC
  Administered 2019-01-07 – 2019-01-11 (×8): 100 mg via ORAL
  Filled 2019-01-07 (×8): qty 1

## 2019-01-07 MED ORDER — FENTANYL CITRATE (PF) 250 MCG/5ML IJ SOLN
INTRAMUSCULAR | Status: AC
  Filled 2019-01-07: qty 5

## 2019-01-07 MED ORDER — HALOPERIDOL LACTATE 5 MG/ML IJ SOLN
INTRAMUSCULAR | Status: AC
  Filled 2019-01-07: qty 1

## 2019-01-07 MED ORDER — MENTHOL 3 MG MT LOZG: 1.0000 | LOZENGE | OROMUCOSAL | Status: DC | PRN

## 2019-01-07 MED ORDER — PROPOFOL 10 MG/ML IV BOLUS
INTRAVENOUS | Status: DC | PRN
  Administered 2019-01-07: 50 mg via INTRAVENOUS

## 2019-01-07 MED ORDER — CEFAZOLIN SODIUM-DEXTROSE 2-4 GM/100ML-% IV SOLN
2.0000 g | Freq: Once | INTRAVENOUS | Status: AC
  Administered 2019-01-07: 2 g via INTRAVENOUS

## 2019-01-07 MED ORDER — ONDANSETRON HCL 4 MG/2ML IJ SOLN
INTRAMUSCULAR | Status: AC
  Filled 2019-01-07: qty 2

## 2019-01-07 MED ORDER — ONDANSETRON HCL 4 MG/2ML IJ SOLN
INTRAMUSCULAR | Status: DC | PRN
  Administered 2019-01-07: 4 mg via INTRAVENOUS

## 2019-01-07 MED ORDER — FENTANYL CITRATE (PF) 100 MCG/2ML IJ SOLN
INTRAMUSCULAR | Status: DC | PRN
  Administered 2019-01-07 (×4): 25 ug via INTRAVENOUS

## 2019-01-07 MED ORDER — LIDOCAINE 2% (20 MG/ML) 5 ML SYRINGE
INTRAMUSCULAR | Status: AC
  Filled 2019-01-07: qty 5

## 2019-01-07 MED ORDER — CEFAZOLIN SODIUM-DEXTROSE 2-3 GM-%(50ML) IV SOLR: INTRAVENOUS | Status: DC | PRN

## 2019-01-07 MED ORDER — ONDANSETRON HCL 4 MG/2ML IJ SOLN: 4.0000 mg | Freq: Once | INTRAMUSCULAR | Status: DC | PRN

## 2019-01-07 MED ORDER — ONDANSETRON HCL 4 MG/2ML IJ SOLN: 4.0000 mg | Freq: Four times a day (QID) | INTRAMUSCULAR | Status: DC | PRN

## 2019-01-07 MED ORDER — LACTATED RINGERS IV SOLN
INTRAVENOUS | Status: DC | PRN
  Administered 2019-01-07 (×2): via INTRAVENOUS

## 2019-01-07 MED ORDER — METOCLOPRAMIDE HCL 5 MG PO TABS: 5.0000 mg | ORAL_TABLET | Freq: Three times a day (TID) | ORAL | Status: DC | PRN

## 2019-01-07 MED ORDER — OLANZAPINE 5 MG PO TABS: 5.0000 mg | ORAL_TABLET | Freq: Every day | ORAL | Status: DC | PRN

## 2019-01-07 MED ORDER — 0.9 % SODIUM CHLORIDE (POUR BTL) OPTIME
TOPICAL | Status: DC | PRN
  Administered 2019-01-07: 1000 mL

## 2019-01-07 MED ORDER — ENOXAPARIN SODIUM 30 MG/0.3ML ~~LOC~~ SOLN
30.0000 mg | SUBCUTANEOUS | Status: DC
  Administered 2019-01-08 – 2019-01-11 (×4): 30 mg via SUBCUTANEOUS
  Filled 2019-01-07 (×4): qty 0.3

## 2019-01-07 MED ORDER — TRANEXAMIC ACID-NACL 1000-0.7 MG/100ML-% IV SOLN
1000.0000 mg | INTRAVENOUS | Status: AC
  Administered 2019-01-07: 1000 mg via INTRAVENOUS
  Filled 2019-01-07: qty 100

## 2019-01-07 MED ORDER — PROPOFOL 10 MG/ML IV BOLUS
INTRAVENOUS | Status: AC
  Filled 2019-01-07: qty 20

## 2019-01-07 SURGICAL SUPPLY — 52 items
ALCOHOL 70% 16 OZ (MISCELLANEOUS) ×3 IMPLANT
BIT DRILL AO GAMMA 4.2X340 (BIT) ×3 IMPLANT
BNDG COHESIVE 4X5 TAN STRL (GAUZE/BANDAGES/DRESSINGS) IMPLANT
CHLORAPREP W/TINT 26 (MISCELLANEOUS) ×3 IMPLANT
COVER PERINEAL POST (MISCELLANEOUS) ×3 IMPLANT
COVER SURGICAL LIGHT HANDLE (MISCELLANEOUS) ×3 IMPLANT
COVER WAND RF STERILE (DRAPES) ×3 IMPLANT
DERMABOND ADVANCED (GAUZE/BANDAGES/DRESSINGS) ×4
DERMABOND ADVANCED .7 DNX12 (GAUZE/BANDAGES/DRESSINGS) ×2 IMPLANT
DRAPE C-ARM 42X72 X-RAY (DRAPES) ×3 IMPLANT
DRAPE C-ARMOR (DRAPES) ×3 IMPLANT
DRAPE HALF SHEET 40X57 (DRAPES) ×3 IMPLANT
DRAPE IMP U-DRAPE 54X76 (DRAPES) ×6 IMPLANT
DRAPE STERI IOBAN 125X83 (DRAPES) ×3 IMPLANT
DRAPE U-SHAPE 47X51 STRL (DRAPES) ×6 IMPLANT
DRESSING ALLEVYN LITE 5X5 NADH (GAUZE/BANDAGES/DRESSINGS) ×1 IMPLANT
DRSG ALLEVYN LITE 5X5 NADH (GAUZE/BANDAGES/DRESSINGS) ×3
DRSG MEPILEX BORDER 4X4 (GAUZE/BANDAGES/DRESSINGS) ×6 IMPLANT
DRSG PAD ABDOMINAL 8X10 ST (GAUZE/BANDAGES/DRESSINGS) IMPLANT
ELECT REM PT RETURN 9FT ADLT (ELECTROSURGICAL) ×3
ELECTRODE REM PT RTRN 9FT ADLT (ELECTROSURGICAL) ×1 IMPLANT
FACESHIELD WRAPAROUND (MASK) ×3 IMPLANT
GLOVE BIO SURGEON STRL SZ8.5 (GLOVE) ×6 IMPLANT
GLOVE BIOGEL PI IND STRL 8.5 (GLOVE) ×1 IMPLANT
GLOVE BIOGEL PI INDICATOR 8.5 (GLOVE) ×2
GOWN STRL REUS W/ TWL LRG LVL3 (GOWN DISPOSABLE) ×2 IMPLANT
GOWN STRL REUS W/TWL 2XL LVL3 (GOWN DISPOSABLE) ×3 IMPLANT
GOWN STRL REUS W/TWL LRG LVL3 (GOWN DISPOSABLE) ×4
K-WIRE  3.2X450M STR (WIRE) ×2
K-WIRE 3.2X450M STR (WIRE) ×1
KIT TURNOVER KIT B (KITS) ×3 IMPLANT
KWIRE 3.2X450M STR (WIRE) ×1 IMPLANT
MANIFOLD NEPTUNE II (INSTRUMENTS) ×3 IMPLANT
MARKER SKIN DUAL TIP RULER LAB (MISCELLANEOUS) ×3 IMPLANT
NAIL TROCH GAMMA 11X18 (Nail) ×3 IMPLANT
NS IRRIG 1000ML POUR BTL (IV SOLUTION) ×3 IMPLANT
PACK GENERAL/GYN (CUSTOM PROCEDURE TRAY) ×3 IMPLANT
PACK UNIVERSAL I (CUSTOM PROCEDURE TRAY) ×3 IMPLANT
PAD ARMBOARD 7.5X6 YLW CONV (MISCELLANEOUS) ×6 IMPLANT
PADDING CAST ABS 4INX4YD NS (CAST SUPPLIES)
PADDING CAST ABS COTTON 4X4 ST (CAST SUPPLIES) IMPLANT
SCREW LAG GAMMA 3 TI 10.5X90MM (Screw) ×3 IMPLANT
SCREW LOCKING T2 F/T  5X32.5MM (Screw) ×2 IMPLANT
SCREW LOCKING T2 F/T 5X32.5MM (Screw) ×1 IMPLANT
SUT MNCRL AB 3-0 PS2 18 (SUTURE) ×3 IMPLANT
SUT MNCRL AB 3-0 PS2 27 (SUTURE) ×3 IMPLANT
SUT MON AB 2-0 CT1 27 (SUTURE) ×3 IMPLANT
SUT MON AB 2-0 CT1 36 (SUTURE) ×3 IMPLANT
SUT VIC AB 1 CT1 27 (SUTURE) ×2
SUT VIC AB 1 CT1 27XBRD ANBCTR (SUTURE) ×1 IMPLANT
TOWEL GREEN STERILE (TOWEL DISPOSABLE) ×3 IMPLANT
TOWEL GREEN STERILE FF (TOWEL DISPOSABLE) ×3 IMPLANT

## 2019-01-07 NOTE — Progress Notes (Signed)
PT Cancellation Note  Patient Details Name: Ann Garza MRN: [id number] DOB: 08-Dec-1922   Cancelled Treatment:     Per nursing patient had just returned from surgery and was confused and emotional. She requested follow up on 01/08/2019   [name] PT DPT  01/07/2019, 3:59 PM

## 2019-01-07 NOTE — Progress Notes (Signed)
Patient is very sad and crying, wanting to go home, attempting to crawl out of bed.  Son remains at bedside, received orders from Dr. Izetta Dakin.

## 2019-01-07 NOTE — Discharge Instructions (Signed)
 Dr. Ita Fritzsche [facility] [address] [place], [place] [address] [phone]   POSTOPERATIVE DIRECTIONS    Hip Rehabilitation, Guidelines Following Surgery   WEIGHT BEARING Weight bearing as tolerated with assist device (walker, cane, etc) as directed, use it as long as suggested by your surgeon or therapist, typically at least 4-6 weeks.   HOME CARE INSTRUCTIONS  Remove items at home which could result in a fall. This includes throw rugs or furniture in walking pathways.  Continue medications as instructed at time of discharge.  You may have some home medications which will be placed on hold until you complete the course of blood thinner medication.  4 days after discharge, you may start showering. No tub baths or soaking your incisions. Do not put on socks or shoes without following the instructions of your caregivers.   Sit on chairs with arms. Use the chair arms to help push yourself up when arising.  Arrange for the use of a toilet seat elevator so you are not sitting low.   Walk with walker as instructed.  You may resume a sexual relationship in one month or when given the OK by your caregiver.  Use walker as long as suggested by your caregivers.  Avoid periods of inactivity such as sitting longer than an hour when not asleep. This helps prevent blood clots.  You may return to work once you are cleared by your surgeon.  Do not drive a car for 6 weeks or until released by your surgeon.  Do not drive while taking narcotics.  Wear elastic stockings for two weeks following surgery during the day but you may remove then at night.  Make sure you keep all of your appointments after your operation with all of your doctors and caregivers. You should call the office at the above phone number and make an appointment for approximately two weeks after the date of your surgery. Please pick up a stool softener and laxative  for home use as long as you are requiring pain medications.  ICE to the affected hip every three hours for 30 minutes at a time and then as needed for pain and swelling. Continue to use ice on the hip for pain and swelling from surgery. You may notice swelling that will progress down to the foot and ankle.  This is normal after surgery.  Elevate the leg when you are not up walking on it.   It is important for you to complete the blood thinner medication as prescribed by your doctor.  Continue to use the breathing machine which will help keep your temperature down.  It is common for your temperature to cycle up and down following surgery, especially at night when you are not up moving around and exerting yourself.  The breathing machine keeps your lungs expanded and your temperature down.  RANGE OF MOTION AND STRENGTHENING EXERCISES  These exercises are designed to help you keep full movement of your hip joint. Follow your caregiver's or physical therapist's instructions. Perform all exercises about fifteen times, three times per day or as directed. Exercise both hips, even if you have had only one joint replacement. These exercises can be done on a training (exercise) mat, on the floor, on a table or on a bed. Use whatever works the best and is most comfortable for you. Use music or television while you are exercising so that the exercises are a pleasant break in your day. This   will make your life better with the exercises acting as a break in routine you can look forward to.  Lying on your back, slowly slide your foot toward your buttocks, raising your knee up off the floor. Then slowly slide your foot back down until your leg is straight again.  Lying on your back spread your legs as far apart as you can without causing discomfort.  Lying on your side, raise your upper leg and foot straight up from the floor as far as is comfortable. Slowly lower the leg and repeat.  Lying on your back, tighten up the  muscle in the front of your thigh (quadriceps muscles). You can do this by keeping your leg straight and trying to raise your heel off the floor. This helps strengthen the largest muscle supporting your knee.  Lying on your back, tighten up the muscles of your buttocks both with the legs straight and with the knee bent at a comfortable angle while keeping your heel on the floor.   SKILLED REHAB INSTRUCTIONS: If the patient is transferred to a skilled rehab facility following release from the hospital, a list of the current medications will be sent to the facility for the patient to continue.  When discharged from the skilled rehab facility, please have the facility set up the patient's Home Health Physical Therapy prior to being released. Also, the skilled facility will be responsible for providing the patient with their medications at time of release from the facility to include their pain medication and their blood thinner medication. If the patient is still at the rehab facility at time of the two week follow up appointment, the skilled rehab facility will also need to assist the patient in arranging follow up appointment in our office and any transportation needs.  MAKE SURE YOU:  Understand these instructions.  Will watch your condition.  Will get help right away if you are not doing well or get worse.  Pick up stool softner and laxative for home use following surgery while on pain medications. Daily dry dressing changes as needed. In 4 days, you may remove your dressings and begin taking showers - no tub baths or soaking the incisions. Continue to use ice for pain and swelling after surgery. Do not use any lotions or creams on the incision until instructed by your surgeon.   

## 2019-01-07 NOTE — Anesthesia Procedure Notes (Addendum)
Procedure Name: LMA Insertion Date/Time: 01/07/2019 8:04 AM Performed by: Leonor Liv, CRNA Pre-anesthesia Checklist: Patient identified, Emergency Drugs available, Suction available and Patient being monitored Patient Re-evaluated:Patient Re-evaluated prior to induction Oxygen Delivery Method: Circle System Utilized Preoxygenation: Pre-oxygenation with 100% oxygen Induction Type: IV induction Ventilation: Mask ventilation without difficulty LMA: LMA inserted LMA Size: 4.0 Number of attempts: 1 Placement Confirmation: positive ETCO2 Tube secured with: Tape Dental Injury: Teeth and Oropharynx as per pre-operative assessment

## 2019-01-07 NOTE — Progress Notes (Signed)
PROGRESS NOTE    Ann Garza  [id number] DOB: 24-Mar-1922 DOA: 01/05/2019 PCP: System, Provider Not In    Brief Narrative:  Ann Garza is a 85 y.o. female with medical history significant of falls, is brought to the hospital today after suffering a fall.  Her most recent fall was on 11/2 when she was evaluated at [facility] and was found to have a left humerus fracture.  Patient reports that she had no dizziness prior to her fall today.  She did not complain of chest pain or shortness of breath.  She did not lose consciousness.  She has not had any fever, cough.  Patient was evaluated in the emergency room where x-rays confirmed left-sided hip fracture.  Case was reviewed with orthopedics, Dr. Aline Brochure who felt that patient was too high risk from cardiac perspective to undergo surgery at [facility] and is recommended transfer to [facility].  EDP discussed case with Dr. Doreatha Martin on-call for orthopedics at [facility].    Patient transferred here early morning on 01/06/2019.  She is pending cardiology clearance for orthopedic surgery early this afternoon.  Assessment & Plan:   Principal Problem:   Closed left hip fracture [facility]) Active Problems:   Fall at home, initial encounter   Closed fracture of proximal end of left humerus   Unsp fracture of fifth metacarpal bone, left hand, init   Osteopenia   Hypokalemia  Left hip fracture -Ortho following - S/P Intramedullary fixation, Left femur on 01/07/19; POD 0  -Cardiology consulted and given clearance -Pain management per surgical team - H/H stable  -  PT and OT    AKI -We will need perioperative IV fluids and  - Encourage p.o. intake postoperatively - Avoid nephrotoxic medications - May give gentle hydration as her PO intake is poor at this time.  -Monitor urine output and daily creatinine   Coronary artery disease: Hx of  - No new meds   Frequent falls -PT and OT postoperatively -Case management and  social work consult; need to be involved for safe placement and disposition eventually   DVT prophylaxis: Heparin SQ Code Status:DNR   Consultants:   Orthopedic surgery  Cardiology  Procedures:   Pending surgery for left hip fracture  Antimicrobials:  None   Subjective: S/P Intramedullary fixation, Left femur today. Was very confused and delirious. Son at the bed side.  Tolerating PO diet but not much interested.   Objective: Vitals:   01/07/19 0325 01/07/19 1010 01/07/19 1025 01/07/19 1047  BP: 132/62 (!) 164/83 (!) 165/85 (!) 160/88  Pulse: 92 84 91 92  Resp: 12 (!) 22 15 16   Temp: 98.7 F (37.1 C) (!) 97 F (36.1 C)  (!) 97.4 F (36.3 C)  TempSrc: Oral   Oral  SpO2: 95% 100% 100% 100%  Weight:      Height:        Intake/Output Summary (Last 24 hours) at 01/07/2019 1356 Last data filed at 01/07/2019 1100 Gross per 24 hour  Intake 1566 ml  Output 550 ml  Net 1016 ml   Filed Weights   01/05/19 1415 01/06/19 0957  Weight: 41.7 kg 41.7 kg    Examination:  General: Elderly frail Caucasian female resting comfortably in no acute distress. HEENT: Normocephalic and atraumatic. Sclera clear. EOMs intact. Neck: Supple.  Heart: RRR. Distinct S1 and S2.   Multiple systolic murmurs on exam.  No gallops or rubs. Radial and distal pedal pulses 2+ and equal bilaterally. Lungs:  Clear to ausculation bilaterally. No wheezes, rhonchi, or rales.  Abdomen: Soft, non-distended, and non-tender to palpation. Bowel sounds present. MSK: Normal strength and tone for age. Extremities: No clubbing, cyanosis, or edema. Surgical wrap on left lateral hip region.  Skin: Warm and dry. Neuro: Alert and oriented xperson  Psych: Normal affect. Responds but incoherent    LOS: 2 days    Time spent: 35 minutes    Thornell Mule, MD [facility] Pager [phone]  If 7PM-7AM, please contact night-coverage [email or web address] Password [email or web address] 01/07/2019, 1:56 PM

## 2019-01-07 NOTE — Anesthesia Procedure Notes (Signed)
Arterial Line Insertion Start/End11/14/2020 7:45 AM, 01/07/2019 7:50 AM Performed by: Audry Pili, MD, anesthesiologist  Patient location: Pre-op. Preanesthetic checklist: patient identified, IV checked, risks and benefits discussed, surgical consent, monitors and equipment checked, pre-op evaluation, timeout performed and anesthesia consent Lidocaine 1% used for infiltration and patient sedated radial was placed Catheter size: 20 G Hand hygiene performed   Attempts: 3 (Previous attempts by CRNAs unsuccessful) Procedure performed using ultrasound guided technique. Ultrasound Notes:anatomy identified, needle tip was noted to be adjacent to the nerve/plexus identified, no ultrasound evidence of intravascular and/or intraneural injection and image(s) printed for medical record Following insertion, dressing applied and Biopatch. Post procedure assessment: unchanged and normal  Patient tolerated the procedure well with no immediate complications.

## 2019-01-07 NOTE — Plan of Care (Signed)
  Problem: Activity: Goal: Ability to ambulate and perform ADLs will improve Outcome: Progressing   Problem: Pain Management: Goal: Pain level will decrease Outcome: Progressing   Problem: Nutrition: Goal: Adequate nutrition will be maintained Outcome: Progressing   Problem: Coping: Goal: Level of anxiety will decrease Outcome: Progressing   Problem: Skin Integrity: Goal: Risk for impaired skin integrity will decrease Outcome: Progressing   Problem: Safety: Goal: Ability to remain free from injury will improve Outcome: Progressing

## 2019-01-07 NOTE — Anesthesia Postprocedure Evaluation (Signed)
Anesthesia Post Note  Patient: Sharl Ma  Procedure(s) Performed: INTRAMEDULLARY (IM) NAIL INTERTROCHANTRIC (Left Hip)     Patient location during evaluation: PACU Anesthesia Type: General Level of consciousness: awake and alert Pain management: pain level controlled Vital Signs Assessment: post-procedure vital signs reviewed and stable Respiratory status: spontaneous breathing, nonlabored ventilation and respiratory function stable Cardiovascular status: blood pressure returned to baseline and stable Postop Assessment: no apparent nausea or vomiting Anesthetic complications: no    Last Vitals:  Vitals:   01/07/19 1025 01/07/19 1047  BP: (!) 165/85 (!) 160/88  Pulse: 91 92  Resp: 15 16  Temp:  (!) 36.3 C  SpO2: 100% 100%    Last Pain:  Vitals:   01/07/19 1047  TempSrc: Oral  PainSc:                  Audry Pili

## 2019-01-07 NOTE — Progress Notes (Signed)
ORTHOPAEDIC CONSULTATION  REQUESTING PHYSICIAN: Thornell Mule, MD  PCP:  System, Provider Not In  Chief Complaint: Left hip injury  HPI: Ann Garza is a 85 y.o. female with a history of multiple recent falls, CAD, anemia, HOH, and osteoporosis who was recently admitted after sustaining a ground-level fall.  She had left hip pain and inability to weight-bear.  She was brought to the emergency department at [facility], where x-rays revealed a displaced left intertrochanteric femur fracture.  She was seen by Dr. Aline Brochure with orthopedics, who recommended transfer to [facility].  The surgery was scheduled for yesterday with Dr. Doreatha Martin, however the case was canceled in order to pursue further cardiac work-up.  Dr. Doreatha Martin then asked me to assume care of her left hip fracture.  Patient is also found to have a left fifth metacarpal fracture which has been splinted.  She had a fall on 11/2 and was evaluated at [facility] and found to have a left proximal humerus fracture.  She has also had a previous T8 compression fracture.  She has been seen by cardiology for perioperative risk stratification and medical optimization.  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   Coronary artery disease    cath 06/15/11 EF55%   Elevated glucose    Hearing loss    Heart murmur    suspect aortic stenosis   Hypercalcemia    Hypertension    MI, old    Osteoporosis    Wedge compression fracture of T8 vertebra (HCC)    Past Surgical History:  Procedure Laterality Date   COLON SURGERY     FRACTURE SURGERY     arm   Social History   Socioeconomic History   Marital status: Widowed    Spouse name: Not on file   Number of children: Not on file   Years of education: Not on file   Highest education level: Not on file  Occupational History   Not on file  Social Needs   Financial resource strain: Not on  file   Food insecurity    Worry: Not on file    Inability: Not on file   Transportation needs    Medical: Not on file    Non-medical: Not on file  Tobacco Use   Smoking status: Never Smoker  Substance and Sexual Activity   Alcohol use: Not Currently   Drug use: Never   Sexual activity: Not Currently  Lifestyle   Physical activity    Days per week: Not on file    Minutes per session: Not on file   Stress: Not on file  Relationships   Social connections    Talks on phone: Not on file    Gets together: Not on file    Attends religious service: Not on file    Active member of club or organization: Not on file    Attends meetings of clubs or organizations: Not on file    Relationship status: Not on file  Other Topics Concern   Not on file  Social History Narrative   Not on file   History reviewed. No pertinent family history. No Known Allergies Prior to Admission medications   Medication Sig Start Date End Date Taking? Authorizing Provider  Multiple Vitamin (MULTIVITAMIN WITH MINERALS) TABS tablet Take 1 tablet by mouth daily.   Yes [provider]  mupirocin ointment (BACTROBAN) 2 %  Apply 1 application topically 3 (three) times daily.  01/03/19 01/03/20 Yes [provider]  Scott See admin instructions. Nystatin-Hydrocortisone-Zinc Oxide-Lanolin-dermabase (1:1:1:1:1)  - Apply topically twice daily   Yes [provider]   Dg Chest 1 View  Result Date: 01/05/2019 CLINICAL DATA:  85 year old female with history of trauma from a fall today. EXAM: CHEST  1 VIEW COMPARISON:  No priors. FINDINGS: Lung volumes are low. Mild elevation of the right hemidiaphragm, of uncertain chronicity. No acute consolidative airspace disease. No pneumothorax. No definite suspicious appearing pulmonary nodules or masses are noted. No pleural effusions. No evidence of pulmonary edema. Heart size is mildly enlarged. Upper mediastinal contours are  within normal limits. Aortic atherosclerosis. Visualized bony thorax appears grossly intact. IMPRESSION: 1. Low lung volumes without radiographic evidence of acute cardiopulmonary disease. 2. Aortic atherosclerosis. 3. Mild cardiomegaly. Electronically Signed   By: Vinnie Langton M.D.   On: 01/05/2019 12:59   Ct Head Wo Contrast  Result Date: 01/05/2019 CLINICAL DATA:  85 year old female with fall. EXAM: CT HEAD WITHOUT CONTRAST TECHNIQUE: Contiguous axial images were obtained from the base of the skull through the vertex without intravenous contrast. COMPARISON:  None. FINDINGS: Brain: There is mild age-related atrophy and chronic microvascular ischemic changes. There is no acute intracranial hemorrhage. No mass effect or midline shift. No extra-axial fluid collection. Vascular: No hyperdense vessel or unexpected calcification. Skull: Normal. Negative for fracture or focal lesion. Sinuses/Orbits: No acute finding. Other: Left posterior parietal scalp contusion. IMPRESSION: 1. No acute intracranial hemorrhage. 2. Mild age-related atrophy and chronic microvascular ischemic changes. Electronically Signed   By: Anner Crete M.D.   On: 01/05/2019 12:53   Dg Humerus Left  Result Date: 01/05/2019 CLINICAL DATA:  Fall, pre-existing fracture EXAM: LEFT HUMERUS - 2+ VIEW COMPARISON:  None. FINDINGS: Osteopenia. There is an impacted fracture of the surgical neck of the left humerus, which appears to have some evidence of resorption about the fracture fragments and may be subacute. No other fracture or dislocation of the left humerus. Soft tissues are unremarkable. IMPRESSION: 1. Osteopenia. 2. There is an impacted fracture of the surgical neck of the left humerus, which appears to have some evidence of resorption about the fracture fragments and may be subacute. No other fracture or dislocation of the left humerus. Electronically Signed   By: Eddie Candle M.D.   On: 01/05/2019 14:29   Dg Hand Complete  Left  Result Date: 01/05/2019 CLINICAL DATA:  Fall, pain EXAM: LEFT HAND - COMPLETE 3+ VIEW COMPARISON:  None. FINDINGS: Profound osteopenia. There is an angulated fracture of the distal left fifth metacarpal. 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 Intravenous catheter in the dorsum of the wrist. IMPRESSION: 1. Profound osteopenia. 2. There is an angulated fracture of the distal left fifth metacarpal. 3. 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 Electronically Signed   By: Eddie Candle M.D.   On: 01/05/2019 14:27   Dg Hip Unilat With Pelvis 2-3 Views Left  Result Date: 01/05/2019 CLINICAL DATA:  Patient fell today and has pain in the left hip with obvious shortening of the leg according to ems. She did have an injury to the femoral head in September, but the family reported no fracture. Hx HTN, CAD, nonsmoker EXAM: DG HIP (WITH OR WITHOUT PELVIS) 2-3V LEFT COMPARISON:  None. FINDINGS: There is a comminuted intertrochanteric fracture of the proximal left femur. The fracture is displaced, distal shaft fracture component displacing laterally by  2 cm. Lesser and greater trochanteric fracture components are also displaced. There is varus angulation. No other fractures.  No bone lesions. Hip joints, SI joints and symphysis pubis are normally aligned. There is soft tissue edema lateral to the fracture proximal left femur. IMPRESSION: 1. Comminuted, mildly displaced and varus angulated intertrochanteric fracture of the proximal left femur. No dislocation. Electronically Signed   By: Lajean Manes M.D.   On: 01/05/2019 12:59    Positive ROS: All other systems have been reviewed and were otherwise negative with the exception of those mentioned in the HPI and as above.  Physical Exam: General: Alert, no acute distress Cardiovascular: No pedal edema Respiratory: No cyanosis, no use of accessory musculature GI: No organomegaly, abdomen is soft and  non-tender Skin: No lesions in the area of chief complaint Neurologic: Sensation intact distally Psychiatric: Patient is competent for consent with normal mood and affect Lymphatic: No axillary or cervical lymphadenopathy  MUSCULOSKELETAL: Examination of the left hip reveals no skin wounds or lesions.  She is shortened and rotated.  She has pain with attempted logrolling.  She has 1+ pedal pulses.  No focal motor or sensory deficit.  The thigh is swollen but soft.  Assessment: Displaced left intertrochanteric femur fracture History of multiple falls  Plan: I discussed the findings with the patient and her son, Jaquelyn Bitter, over the phone.  Surgical stabilization of the left hip fracture is recommended for pain control and to allow for mobilization out of bed.  We discussed the risk, benefits, and alternatives to intramedullary fixation of the left femur.  Please see statement of risk.  Plan for surgery today.  All questions were solicited and answered.  The risks, benefits, and alternatives were discussed with the patient. There are risks associated with the surgery including, but not limited to, problems with anesthesia (death), infection, differences in leg length/angulation/rotation, fracture of bones, loosening or failure of implants, malunion, nonunion, hematoma (blood accumulation) which may require surgical drainage, blood clots, pulmonary embolism, nerve injury (foot drop), and blood vessel injury. The patient understands these risks and elects to proceed.   Bertram Savin, MD [phone]    01/07/2019 7:38 AM

## 2019-01-07 NOTE — Transfer of Care (Signed)
Immediate Anesthesia Transfer of Care Note  Patient: Ann Garza  Procedure(s) Performed: INTRAMEDULLARY (IM) NAIL INTERTROCHANTRIC (Left Hip)  Patient Location: PACU  Anesthesia Type:General  Level of Consciousness: drowsy  Airway & Oxygen Therapy: Patient Spontanous Breathing and Patient connected to nasal cannula oxygen  Post-op Assessment: Report given to RN, Post -op Vital signs reviewed and stable and Patient moving all extremities  Post vital signs: Reviewed and stable  Last Vitals:  Vitals Value Taken Time  BP 164/83 01/07/19 1010  Temp    Pulse 84 01/07/19 1015  Resp 17 01/07/19 1015  SpO2 100 % 01/07/19 1015  Vitals shown include unvalidated device data.  Last Pain:  Vitals:   01/07/19 0600  TempSrc:   PainSc: Asleep         Complications: No apparent anesthesia complications

## 2019-01-07 NOTE — Op Note (Signed)
OPERATIVE REPORT  SURGEON: Rod Can, MD   ASSISTANT: Georgann Housekeeper, RNFA.  PREOPERATIVE DIAGNOSIS: Left intertrochanteric femur fracture.   POSTOPERATIVE DIAGNOSIS: Left intertrochanteric femur fracture.   PROCEDURE: Intramedullary fixation, Left femur.   IMPLANTS: Stryker Gamma3 Hip Fracture Nail, 11 by 180 mm, 125 degrees. 10.5 x 90 mm Hip Fracture Nail Lag Screw. 5 x 32.5 mm distal interlocking screw 1.  ANESTHESIA:  General  ESTIMATED BLOOD LOSS:-50 mL    ANTIBIOTICS: 2 g Ancef.  DRAINS: None.  COMPLICATIONS: None.   CONDITION: PACU - hemodynamically stable.Marland Kitchen   BRIEF CLINICAL NOTE: Ann Garza is a 85 y.o. female who presented with an intertrochanteric femur fracture. The patient was admitted to the hospitalist service and underwent perioperative risk stratification and medical optimization. The risks, benefits, and alternatives to the procedure were explained, and the patient elected to proceed.  PROCEDURE IN DETAIL: Surgical site was marked by myself. The patient was taken to the operating room and anesthesia was induced on the bed. The patient was then transferred to the [address] [place] [address] table and the nonoperative lower extremity was scissored underneath the operative side. The fracture was reduced with traction, internal rotation, and adduction. The hip was prepped and draped in the normal sterile surgical fashion. Timeout was called verifying side and site of surgery. Preop antibiotics were given with 60 minutes of beginning the procedure.  Fluoroscopy was used to define the patient's anatomy. A 4 cm incision was made just proximal to the tip of the greater trochanter. The awl was used to obtain the standard starting point for a trochanteric entry nail under fluoroscopic control. The guidepin was placed. The entry reamer was used to open the proximal femur.  On the back table, the nail was assembled onto the jig. The nail was placed into the femur without any difficulty.  Through a separate stab incision, the cannula was placed down to the bone in preparation for the cephalomedullary device. A guidepin was placed into the femoral head using AP and lateral fluoroscopy views. The pin was measured, and then reaming was performed to the appropriate depth. The lag screw was inserted to the appropriate depth. The fracture was compressed through the jig. The setscrew was tightened and then loosened one quarter turn. A separate stab incision was created, and the distal interlocking screw was placed using standard AO technique. The jig was removed. Final AP and lateral fluoroscopy views were obtained to confirm fracture reduction and hardware placement. Tip apex distance was appropriate. There was no chondral penetration.  The wounds were copiously irrigated with saline. The wound was closed in layers with #1 Vicryl for the fascia, 2-0 Monocryl for the deep dermal layer, and 3-0 Monocryl subcuticular stitch. Glue was applied to the skin. Once the glue was fully hardened, sterile dressing was applied. The patient was then awakened from anesthesia and taken to the PACU in stable condition. Sponge needle and instrument counts were correct at the end of the case 2. There were no known complications.  We will readmit the patient to the hospitalist. Weightbearing status will be weightbearing as tolerated with a walker. We will begin Lovenox for DVT prophylaxis. The patient will work with physical therapy and undergo disposition planning.  Please note that a surgical assistant was a medical necessity for this procedure to perform it in a safe and expeditious manner. Assistant was necessary to provide appropriate retraction of vital neurovascular structures, to prevent femoral fracture, and to allow for anatomic placement of the prosthesis.

## 2019-01-08 ENCOUNTER — Encounter (HOSPITAL_COMMUNITY): Payer: Self-pay | Admitting: *Deleted

## 2019-01-08 DIAGNOSIS — I350 Nonrheumatic aortic (valve) stenosis: Secondary | ICD-10-CM

## 2019-01-08 LAB — BASIC METABOLIC PANEL
Anion gap: 12 (ref 5–15)
BUN: 14 mg/dL (ref 8–23)
CO2: 21 mmol/L — ABNORMAL LOW (ref 22–32)
Calcium: 7.9 mg/dL — ABNORMAL LOW (ref 8.9–10.3)
Chloride: 108 mmol/L (ref 98–111)
Creatinine, Ser: 0.9 mg/dL (ref 0.44–1.00)
GFR calc Af Amer: >60 mL/min (ref >60)
GFR calc non Af Amer: 55 mL/min — ABNORMAL LOW (ref >60)
Glucose, Bld: 140 mg/dL — ABNORMAL HIGH (ref 70–99)
Potassium: 3.7 mmol/L (ref 3.5–5.1)
Sodium: 141 mmol/L (ref 135–145)

## 2019-01-08 LAB — CBC
HCT: 25.8 % — ABNORMAL LOW (ref 36.0–46.0)
Hemoglobin: 8.6 g/dL — ABNORMAL LOW (ref 12.0–15.0)
MCH: 29.7 pg (ref 26.0–34.0)
MCHC: 33.3 g/dL (ref 30.0–36.0)
MCV: 89 fL (ref 80.0–100.0)
Platelets: 137 10*3/uL — ABNORMAL LOW (ref 150–400)
RBC: 2.9 MIL/uL — ABNORMAL LOW (ref 3.87–5.11)
RDW: 14.9 % (ref 11.5–15.5)
WBC: 7.4 10*3/uL (ref 4.0–10.5)
nRBC: 0 % (ref 0.0–0.2)

## 2019-01-08 LAB — HEMOGLOBIN AND HEMATOCRIT, BLOOD
HCT: 24.8 % — ABNORMAL LOW (ref 36.0–46.0)
Hemoglobin: 8.1 g/dL — ABNORMAL LOW (ref 12.0–15.0)

## 2019-01-08 NOTE — Social Work (Signed)
CSW acknowledging consult for SNF placement. Will follow for therapy recommendations needed to best determine disposition/for insurance authorization.   Kamarrion Stfort, MSW, LCSWA [place] [facility] [phone]   

## 2019-01-08 NOTE — Evaluation (Signed)
Physical Therapy Evaluation Patient Details Name: Ann Garza MRN: [id number] DOB: 1922/08/10 Today's Date: 01/08/2019   History of Present Illness  Ann Garza is a 85 y.o. female with medical history significant of falls, is brought to the hospital today after suffering a fall.  Her most recent fall was on 11/2 when she was evaluated at [facility] and was found to have a left humerus fracture. Fell again 11/12 resulting in L hip fracture; after getting Cardiac clearance, had L hip IM nail on 11/14, WBAT;  has a past medical history of Anemia, Cancer ([place]), Coronary artery disease, Elevated glucose, Hearing loss, Heart murmur, Hypercalcemia, Hypertension, MI, old, Osteoporosis, and Wedge compression fracture of T8 vertebra ([place]).  Clinical Impression   Patient is s/p above surgery resulting in functional limitations due to the deficits listed below (see PT Problem List). Managing at home with family prior to this admission, but with multiple falls; Presents to PT with decr strength, pain, significant fall risk, AMS; Patient will benefit from skilled PT to increase their independence and safety with mobility to allow discharge to the venue listed below.    Noted L humerus and L 5th MC fractures; will porceed NWB LUE unless otherwise ordered.     Follow Up Recommendations SNF    Equipment Recommendations  Other (comment)(to be determined)    Recommendations for Other Services       Precautions / Restrictions Precautions Precautions: Fall Restrictions LUE Weight Bearing: Non weight bearing LLE Weight Bearing: Weight bearing as tolerated      Mobility  Bed Mobility Overal bed mobility: Needs Assistance Bed Mobility: Supine to Sit     Supine to sit: +2 for physical assistance;Max assist     General bed mobility comments: Moved slowly, used bed pad to move hips to EOB; +2 assist to elevate trunk to sit  Transfers Overall transfer level: Needs  assistance Equipment used: 2 person hand held assist(and bil support at gait belt) Transfers: Sit to/from [facility] Sit to Stand: +2 physical assistance;Mod assist Stand pivot transfers: +2 physical assistance;Max assist       General transfer comment: Heavy mod assist to power up t stand, but noted good weight bearing response and push up to stand with LLE; Max assist needed to weight shift and turn to sit in the chair  Ambulation/Gait                Stairs            Wheelchair Mobility    Modified Rankin (Stroke Patients Only)       Balance Overall balance assessment: Needs assistance     Sitting balance - Comments: Initially Max assist once up to EOB, and pt was very anxious; cues to relax, slow down, and PT sat beside her on the bed; progressed to minguard assist to sit EOB                                     Pertinent Vitals/Pain Pain Assessment: Faces Faces Pain Scale: Hurts little more Pain Location: L hip/thigh; she didn't mention her LUE much re: pain Pain Descriptors / Indicators: Grimacing(asking to move slowly) Pain Intervention(s): Monitored during session    Home Living Family/patient expects to be discharged to:: Unsure Living Arrangements: Children               Additional Comments: Difficult historian and  family unavailable    Prior Function           Comments: Unable to discern PLOF form patient     Hand Dominance        Extremity/Trunk Assessment   Upper Extremity Assessment Upper Extremity Assessment: Generalized weakness;LUE deficits/detail LUE Deficits / Details: Noted hand was wrapped in ACE wrap; Placed LUE in sling, and kept NWB    Lower Extremity Assessment Lower Extremity Assessment: Generalized weakness;LLE deficits/detail LLE Deficits / Details: Grossly decr aROM and strength L LE, limited by pain; Notable muscle recritment and weight bearing response with sit to stand     Cervical / Trunk Assessment Cervical / Trunk Assessment: Kyphotic  Communication   Communication: HOH  Cognition Arousal/Alertness: (Sleepy, but easily arousable) Behavior During Therapy: WFL for tasks assessed/performed Overall Cognitive Status: Difficult to assess                                        General Comments General comments (skin integrity, edema, etc.): Took time to place sling on LUE    Exercises     Assessment/Plan    PT Assessment Patient needs continued PT services  PT Problem List Decreased strength;Decreased range of motion;Decreased activity tolerance;Decreased balance;Decreased mobility;Decreased coordination;Decreased cognition;Decreased knowledge of use of DME;Decreased safety awareness;Decreased knowledge of precautions;Pain       PT Treatment Interventions DME instruction;Gait training;Functional mobility training;Therapeutic activities;Therapeutic exercise;Balance training;Neuromuscular re-education;Cognitive remediation;Patient/family education    PT Goals (Current goals can be found in the Care Plan section)  Acute Rehab PT Goals Patient Stated Goal: Did not state, but agreeable to OOB PT Goal Formulation: With patient Time For Goal Achievement: 01/22/19 Potential to Achieve Goals: Good    Frequency Min 2X/week   Barriers to discharge Other (comment) Unsure of home situation; Still, with recent falls and fractures, SNF is most likely where her mobility, ADL, and safety needs will be best met    Co-evaluation               AM-PAC PT "6 Clicks" Mobility  Outcome Measure Help needed turning from your back to your side while in a flat bed without using bedrails?: Total Help needed moving from lying on your back to sitting on the side of a flat bed without using bedrails?: Total Help needed moving to and from a bed to a chair (including a wheelchair)?: A Lot Help needed standing up from a chair using your arms (e.g.,  wheelchair or bedside chair)?: A Lot Help needed to walk in hospital room?: Total Help needed climbing 3-5 steps with a railing? : Total 6 Click Score: 8    End of Session Equipment Utilized During Treatment: Gait belt Activity Tolerance: Patient tolerated treatment well Patient left: in chair;with call bell/phone within reach;with chair alarm set Nurse Communication: Mobility status PT Visit Diagnosis: Unsteadiness on feet (R26.81);Other abnormalities of gait and mobility (R26.89);Repeated falls (R29.6);Muscle weakness (generalized) (M62.81);History of falling (Z91.81);Adult, failure to thrive (R62.7)    Time: 0940-1010 PT Time Calculation (min) (ACUTE ONLY): 30 min   Charges:   PT Evaluation $PT Eval Moderate Complexity: 1 Mod PT Treatments $Therapeutic Activity: 8-22 mins        Roney Marion, PT  Acute Rehabilitation Services Pager [phone] Office [place] 01/08/2019, 4:23 PM

## 2019-01-08 NOTE — Progress Notes (Addendum)
PROGRESS NOTE    Ann Garza  [id number] DOB: 31-Jul-1922 DOA: 01/05/2019 PCP: System, Provider Not In    Brief Narrative:  Ann Garza is a 85 y.o. female with medical history significant of falls, is brought to the hospital today after suffering a fall.  Her most recent fall was on 11/2 when she was evaluated at [facility] and was found to have a left humerus fracture.  Patient reports that she had no dizziness prior to her fall today.  She did not complain of chest pain or shortness of breath.  She did not lose consciousness.  She has not had any fever, cough.  Patient was evaluated in the emergency room where x-rays confirmed left-sided hip fracture.  Case was reviewed with orthopedics, Dr. Aline Brochure who felt that patient was too high risk from cardiac perspective to undergo surgery at [facility] and is recommended transfer to [facility].  EDP discussed case with Dr. Doreatha Martin on-call for orthopedics at [facility].    Patient transferred here early morning on 01/06/2019.  She is pending cardiology clearance for orthopedic surgery early this afternoon.  Assessment & Plan:   Principal Problem:   Closed left hip fracture [facility]) Active Problems:   Fall at home, initial encounter   Closed fracture of proximal end of left humerus   Unsp fracture of fifth metacarpal bone, left hand, init   Osteopenia   Hypokalemia   Nonrheumatic aortic valve stenosis  Left hip fracture - S/P Intramedullary fixation, Left femur on 01/07/19; POD 1  -Cardiology consulted and given clearance -Pain management per surgical team - H/H stable  -  PT and OT rec to SNF - SW/CM working on SNF arrangement     AKI - Improved  -  Given perioperative IV fluids and  - Encourage p.o. intake postoperatively - Avoid nephrotoxic medications - Given  gentle hydration as her PO intake is poor at this time.  -Monitor urine output and daily creatinine   Anemia: likely from chronic issues - H/H  dropped to 8.6 postop day 1 - Repeat H/H in 8 hours; if significant drop, transfuse - May check iron panel, B12 and folate (poor diet intake)  - Monitor   Coronary artery disease: Hx of  - No new meds   Frequent falls -PT and OT postoperatively -Case management and social work consult; need to be involved for safe placement and disposition eventually - SNF    DVT prophylaxis: Heparin SQ Code Status:DNR   Consultants:   Orthopedic surgery  Cardiology  Procedures:   Pending surgery for left hip fracture  Antimicrobials:  None   Subjective: S/P Intramedullary fixation, Left femur today. Was very confused and delirious. Son at the bed side.  Tolerating PO diet but not much interested.   Objective: Vitals:   01/07/19 2004 01/08/19 0000 01/08/19 0416 01/08/19 0830  BP: (!) 132/48  128/64 (!) 152/65  Pulse: 93 92 95 (!) 110  Resp: 16 16 16 14   Temp: 99.9 F (37.7 C) 98.4 F (36.9 C) 99.3 F (37.4 C) 100.2 F (37.9 C)  TempSrc: Oral Oral Oral Oral  SpO2: 93% 96% 95% 93%  Weight:      Height:        Intake/Output Summary (Last 24 hours) at 01/08/2019 1215 Last data filed at 01/08/2019 0900 Gross per 24 hour  Intake 360 ml  Output 300 ml  Net 60 ml   Filed Weights   01/05/19 1415 01/06/19 0957  Weight:  41.7 kg 41.7 kg    Examination:  General: Elderly frail Caucasian female resting comfortably in no acute distress. HEENT: Normocephalic and atraumatic. Sclera clear. EOMs intact. Neck: Supple.  Heart: RRR. Distinct S1 and S2.   Multiple systolic murmurs on exam.  No gallops or rubs. Radial and distal pedal pulses 2+ and equal bilaterally. Lungs: Clear to ausculation bilaterally. No wheezes, rhonchi, or rales.  Abdomen: Soft, non-distended, and non-tender to palpation. Bowel sounds present. MSK: Normal strength and tone for age. Extremities: No clubbing, cyanosis, or edema. Surgical wrap on left lateral hip region.  Skin: Warm and dry. Neuro: Alert and  oriented xperson  Psych: Normal affect. Responds but incoherent    LOS: 3 days    Time spent: 35 minutes    Thornell Mule, MD [facility] Pager [phone]  If 7PM-7AM, please contact night-coverage [email or web address] Password [facility] 01/08/2019, 12:15 PM

## 2019-01-08 NOTE — Progress Notes (Signed)
Patient ID: Sharl Ma, female   DOB: [birth year], 85 y.o.   MRN: [id number] Subjective: 1 Day Post-Op Procedure(s) (LRB): INTRAMEDULLARY (IM) NAIL INTERTROCHANTRIC (Left)    Patient awake but conversant this am.  No reported events  Objective:   VITALS:   Vitals:   01/08/19 0000 01/08/19 0416  BP:  128/64  Pulse: 92 95  Resp: 16 16  Temp: 98.4 F (36.9 C) 99.3 F (37.4 C)  SpO2: 96% 95%    Incision: dressing C/D/I  LLE thigh swelling  LABS Recent Labs    01/05/19 1155 01/06/19 1158 01/07/19 0525  HGB 10.2* 8.0* 11.2*  HCT 33.0* 25.4* 33.0*  WBC 8.9 8.2  --   PLT 244 212  --     Recent Labs    01/05/19 1155 01/06/19 1158 01/08/19 0620  NA 145 142 141  K 3.0* 4.8 3.7  BUN 13 18 14   CREATININE 0.58 1.01* 0.90  GLUCOSE 135* 103* 140*    Recent Labs    01/05/19 1155  INR 1.1     Assessment/Plan: 1 Day Post-Op Procedure(s) (LRB): INTRAMEDULLARY (IM) NAIL INTERTROCHANTRIC (Left)   Advance diet Up with therapy  Disposition pending PT assessment of mobility restrictions and needs DVT prophylaxis ordered Limit pain meds to prevent further somnolence

## 2019-01-09 ENCOUNTER — Encounter (HOSPITAL_COMMUNITY): Payer: Self-pay | Admitting: Orthopedic Surgery

## 2019-01-09 DIAGNOSIS — S62307A Unspecified fracture of fifth metacarpal bone, left hand, initial encounter for closed fracture: Secondary | ICD-10-CM

## 2019-01-09 DIAGNOSIS — I10 Essential (primary) hypertension: Secondary | ICD-10-CM

## 2019-01-09 LAB — BASIC METABOLIC PANEL
Anion gap: 9 (ref 5–15)
BUN: 11 mg/dL (ref 8–23)
CO2: 23 mmol/L (ref 22–32)
Calcium: 7.7 mg/dL — ABNORMAL LOW (ref 8.9–10.3)
Chloride: 110 mmol/L (ref 98–111)
Creatinine, Ser: 0.71 mg/dL (ref 0.44–1.00)
GFR calc Af Amer: >60 mL/min (ref >60)
GFR calc non Af Amer: >60 mL/min (ref >60)
Glucose, Bld: 102 mg/dL — ABNORMAL HIGH (ref 70–99)
Potassium: 3.4 mmol/L — ABNORMAL LOW (ref 3.5–5.1)
Sodium: 142 mmol/L (ref 135–145)

## 2019-01-09 LAB — FERRITIN: Ferritin: 228 ng/mL (ref 11–307)

## 2019-01-09 LAB — VITAMIN B12: Vitamin B-12: 620 pg/mL (ref 180–914)

## 2019-01-09 LAB — CBC
HCT: 25 % — ABNORMAL LOW (ref 36.0–46.0)
Hemoglobin: 8.1 g/dL — ABNORMAL LOW (ref 12.0–15.0)
MCH: 30 pg (ref 26.0–34.0)
MCHC: 32.4 g/dL (ref 30.0–36.0)
MCV: 92.6 fL (ref 80.0–100.0)
Platelets: 142 10*3/uL — ABNORMAL LOW (ref 150–400)
RBC: 2.7 MIL/uL — ABNORMAL LOW (ref 3.87–5.11)
RDW: 15.1 % (ref 11.5–15.5)
WBC: 6.2 10*3/uL (ref 4.0–10.5)
nRBC: 0 % (ref 0.0–0.2)

## 2019-01-09 LAB — IRON AND TIBC
Iron: 14 ug/dL — ABNORMAL LOW (ref 28–170)
Saturation Ratios: 8 % — ABNORMAL LOW (ref 10.4–31.8)
TIBC: 169 ug/dL — ABNORMAL LOW (ref 250–450)
UIBC: 155 ug/dL

## 2019-01-09 LAB — FOLATE: Folate: 23.2 ng/mL (ref >5.9)

## 2019-01-09 MED ORDER — POTASSIUM CHLORIDE 20 MEQ PO PACK
40.0000 meq | PACK | Freq: Once | ORAL | Status: AC
  Administered 2019-01-10: 40 meq via ORAL
  Filled 2019-01-09: qty 2

## 2019-01-09 MED ORDER — CALCIUM CARBONATE-VITAMIN D 500-200 MG-UNIT PO TABS
1.0000 | ORAL_TABLET | Freq: Every day | ORAL | Status: DC
  Administered 2019-01-10 – 2019-01-11 (×2): 1 via ORAL
  Filled 2019-01-09 (×2): qty 1

## 2019-01-09 NOTE — Progress Notes (Addendum)
PROGRESS NOTE    Ann Garza  [id number]  DOB: 05-05-1922  DOA: 01/05/2019 PCP: System, Provider Not In  Brief Narrative:  85 y.o.F with h/o HTN, CAD, hearing loss, osteoporosis,compression fracture of T8 vertebra, recurrent falls, presented to [facility]  afte suffering a fall. Her most recent fall was on 11/2 when she was evaluated at [facility] and was found to have a left humerus fracture. Fell again 11/12 resulting in L hip fracture. Transferred to [facility] as felt to be high operative risk. Lab work showed AKI. Hospital course: After getting Cardiac clearance, had L hip IM nail on 11/14, WBAT. Seen by PT who recommends SNF.Renal function improved with IVF, remains on gentle hydration as her PO intake is poor   Subjective:  Patient seen earlier in rounds.  Son at bedside.  States patient at baseline mental status.  She is very hard of hearing.  Postop day 2 after IM nailing.  Objective: Vitals:   01/09/19 0327 01/09/19 0756 01/09/19 1535 01/09/19 1732  BP: (!) 124/56 (!) 135/57 (!) 154/84 128/61  Pulse: 95 91 (!) 118 91  Resp:  18 17 18   Temp: 98 F (36.7 C) 99.2 F (37.3 C) 99.1 F (37.3 C) 98.7 F (37.1 C)  TempSrc: Oral Oral Oral Oral  SpO2: 97% 98% 96% 95%  Weight:      Height:        Intake/Output Summary (Last 24 hours) at 01/09/2019 1929 Last data filed at 01/09/2019 1300 Gross per 24 hour  Intake 852.77 ml  Output -  Net 852.77 ml   Filed Weights   01/05/19 1415 01/06/19 0957  Weight: 41.7 kg 41.7 kg    Physical Examination:  General exam: Appears calm and comfortable  Respiratory system: Clear to auscultation. Respiratory effort normal. Cardiovascular system: S1 & S2 heard, RRR.  Systolic murmur, no JVD. No pedal edema. Gastrointestinal system: Abdomen is nondistended, soft and nontender. No organomegaly or masses felt. Normal bowel sounds heard. Central nervous system: Alert and oriented. No focal neurological deficits. Extremities:  Mild pain with range of motion along left lower extremity. Skin: No rashes, lesions or ulcers Psychiatry: Judgement and insight appear normal. Mood & affect appropriate.     Data Reviewed: I have personally reviewed following labs and imaging studies  CBC: Recent Labs  Lab 01/05/19 1155 01/06/19 1158 01/07/19 0525 01/08/19 0620 01/08/19 2007 01/09/19 0441  WBC 8.9 8.2  --  7.4  --  6.2  NEUTROABS 7.8*  --   --   --   --   --   HGB 10.2* 8.0* 11.2* 8.6* 8.1* 8.1*  HCT 33.0* 25.4* 33.0* 25.8* 24.8* 25.0*  MCV 97.1 96.2  --  89.0  --  92.6  PLT 244 212  --  137*  --  [id number]*   Basic Metabolic Panel: Recent Labs  Lab 01/05/19 1155 01/05/19 1500 01/06/19 1158 01/08/19 0620 01/09/19 0441  NA 145  --  142 141 142  K 3.0*  --  4.8 3.7 3.4*  CL 108  --  107 108 110  CO2 28  --  18* 21* 23  GLUCOSE 135*  --  103* 140* 102*  BUN 13  --  18 14 11   CREATININE 0.58  --  1.01* 0.90 0.71  CALCIUM 9.2  --  8.8* 7.9* 7.7*  MG  --  1.9  --   --   --    GFR: Estimated Creatinine Clearance: 28.3 mL/min (by C-G formula based  on SCr of 0.71 mg/dL). Liver Function Tests: No results for input(s): AST, ALT, ALKPHOS, BILITOT, PROT, ALBUMIN in the last 168 hours. No results for input(s): LIPASE, AMYLASE in the last 168 hours. No results for input(s): AMMONIA in the last 168 hours. Coagulation Profile: Recent Labs  Lab 01/05/19 1155  INR 1.1   Cardiac Enzymes: No results for input(s): CKTOTAL, CKMB, CKMBINDEX, TROPONINI in the last 168 hours. BNP (last 3 results) No results for input(s): PROBNP in the last 8760 hours. HbA1C: No results for input(s): HGBA1C in the last 72 hours. CBG: No results for input(s): GLUCAP in the last 168 hours. Lipid Profile: No results for input(s): CHOL, HDL, LDLCALC, TRIG, CHOLHDL, LDLDIRECT in the last 72 hours. Thyroid Function Tests: No results for input(s): TSH, T4TOTAL, FREET4, T3FREE, THYROIDAB in the last 72 hours. Anemia Panel: Recent Labs     01/09/19 0441  VITAMINB12 620  FOLATE 23.2  FERRITIN 228  TIBC 169*  IRON 14*   Sepsis Labs: No results for input(s): PROCALCITON, LATICACIDVEN in the last 168 hours.  Recent Results (from the past 240 hour(s))  SARS CORONAVIRUS 2 (TAT 6-24 HRS) Nasopharyngeal Nasopharyngeal Swab     Status: None   Collection Time: 01/05/19 11:37 AM   Specimen: Nasopharyngeal Swab  Result Value Ref Range Status   SARS Coronavirus 2 NEGATIVE NEGATIVE Final    Comment: (NOTE) SARS-CoV-2 target nucleic acids are NOT DETECTED. The SARS-CoV-2 RNA is generally detectable in upper and lower respiratory specimens during the acute phase of infection. Negative results do not preclude SARS-CoV-2 infection, do not rule out co-infections with other pathogens, and should not be used as the sole basis for treatment or other patient management decisions. Negative results must be combined with clinical observations, patient history, and epidemiological information. The expected result is Negative. Fact Sheet for Patients: SugarRoll.be Fact Sheet for Healthcare Providers: https://www.woods-mathews.com/ This test is not yet approved or cleared by the [place] FDA and  has been authorized for detection and/or diagnosis of SARS-CoV-2 by FDA under an Emergency Use Authorization (EUA). This EUA will remain  in effect (meaning this test can be used) for the duration of the COVID-19 declaration under Section 56 4(b)(1) of the Act, 21 U.S.C. section 360bbb-3(b)(1), unless the authorization is terminated or revoked sooner. Performed at [facility], [place] [address], [place], [place] [address]   MRSA PCR Screening     Status: None   Collection Time: 01/06/19  6:33 AM   Specimen: Nasal Mucosa; Nasopharyngeal  Result Value Ref Range Status   MRSA by PCR NEGATIVE NEGATIVE Final    Comment:        The GeneXpert MRSA Assay (FDA approved for NASAL specimens only), is  one component of a comprehensive MRSA colonization surveillance program. It is not intended to diagnose MRSA infection nor to guide or monitor treatment for MRSA infections. Performed at [facility], [place] [address] [place], [place] [address]       Radiology Studies: No results found.      Scheduled Meds: . docusate sodium  100 mg Oral BID  . enoxaparin (LOVENOX) injection  30 mg Subcutaneous Q24H   Continuous Infusions: . sodium chloride 50 mL/hr at 01/07/19 1518    Assessment & Plan:    1.  Recurrent falls, left hip fracture: Postop day 2, s/p intramedullary nailing by orthopedics.  Tolerated procedure well.  Off Foley catheter.  On DVT prophylaxis.  Weightbearing as tolerated per orthopedics.  Pain medications  as needed.  Delirium precautions.  Encourage oral fluid intake.  DC IV fluids  2.?  History of hypertension/CAD:  ?Not on any medications at home.  Currently SBP 120-150s without any medications.  Continue to monitor.  Control pain.  DC IV fluids.  Echo in this admission showed stage I diastolic dysfunction and moderate aortic stenosis.  3.  Mild hypokalemia: Replace as needed  4.  Osteoporosis/compression fracture:? Not on any medications at home.  Will start on vitamin D/calcium.      5.  Recent fall with left humerus/left fifth MC fracture: Evaluated at [facility].  Nonweightbearing to left upper extremity for now.  6.  Acute on chronic anemia: Hemoglobin fluctuating between 8-10.  No signs of acute bleeding.  Check iron studies.  Likely anemia of chronic disease with some acute blood loss component in the setting of problem #1.    7. Age-related hearing loss: No acute intervention needed   DVT prophylaxis: Lovenox (watch hemoglobin) Code Status: DNR Family / Patient Communication: Discussed with son at bedside. Disposition Plan: Patient apparently was living alone until recently when she moved in with son.  Son at bedside today and agrees  with PT recommendations for skilled nursing facility.     LOS: 4 days    Time spent: 35 minutes    Guilford Shi, MD [facility] Pager [phone]  If 7PM-7AM, please contact night-coverage [email or web address] Password [facility] 01/09/2019, 7:29 PM

## 2019-01-09 NOTE — Progress Notes (Signed)
Initial Nutrition Assessment  RD working remotely.  DOCUMENTATION CODES:   Not applicable, suspect some degree of malnutrition but RD unable to confirm at this time  INTERVENTION:   - Ensure Enlive po BID, each supplement provides 350 kcal and 20 grams of protein  - Encourage adequate PO intake  NUTRITION DIAGNOSIS:   Increased nutrient needs related to hip fracture, post-op healing as evidenced by estimated needs.  GOAL:   Patient will meet greater than or equal to 90% of their needs  MONITOR:   PO intake, Supplement acceptance, Labs, Weight trends, Skin  REASON FOR ASSESSMENT:   Malnutrition Screening Tool    ASSESSMENT:   85 year old female who presented to the ED on 11/12 with left hip pain after a fall. X-rays revealing left intertrochanteric/subthrochanteric femur fracture. PMH of anemia, CAD, HTN.   11/14 - s/p IMN  Per review of notes, pt is very HOH. RD did not attempt to call pt's room phone because of this.  PT and OT recommending SNF at this time.  Given variable PO intake since admission, RD will order oral nutrition supplements to aid pt in meeting kcal and protein needs.  No weight history available in chart. Given borderline underweight BMI, suspect pt with some degree of malnutrition but RD unable to confirm at this time without NFPE or detailed diet history.  Per RN edema assessment, pt with non-pitting edema to LUE and LLE.  Meal Completion: 0-75% x last 6 documented meals  Medications reviewed and include: Colace IVF: NS @ 50 ml/hr  Labs reviewed: potassium 3.4, hemoglobin 8.1  NUTRITION - FOCUSED PHYSICAL EXAM:  Unable to complete at this time. RD working remotely.  Diet Order:   Diet Order            Diet regular Room service appropriate? Yes; Fluid consistency: Thin  Diet effective now              EDUCATION NEEDS:   No education needs have been identified at this time  Skin:  Skin Assessment: Skin Integrity Issues: Skin  Integrity Issues: Incisions: closed left hip  Last BM:  01/05/19  Height:   Ht Readings from Last 1 Encounters:  01/06/19 4\' 11"  (1.499 m)    Weight:   Wt Readings from Last 1 Encounters:  01/06/19 41.7 kg    Ideal Body Weight:  44.7 kg  BMI:  Body mass index is 18.57 kg/m.  Estimated Nutritional Needs:   Kcal:  1100-1300  Protein:  50-65 grams  Fluid:  >/= 1.2 L    Gaynell Face, MS, RD, LDN Inpatient Clinical Dietitian Pager: [phone] Weekend/After Hours: [phone]

## 2019-01-09 NOTE — TOC Initial Note (Signed)
Transition of Care [facility]) - Initial/Assessment Note    Patient Details  Name: Ann Garza MRN: [id number] Date of Birth: [birth year]  Transition of Care [facility]) CM/SW Contact:    Midge Minium RN, BSN, NCM-BC, ACM-RN [phone] Phone Number: 01/09/2019, 3:24 PM  Clinical Narrative:                 CM following for dispositional needs. CM spoke to the patients son, Angeliyah Daluz, via phone to discuss the POC d/t the patient cognitive decline. Patient is a 85 y.o. female with a significant history of falls; s/p L hip IM nailing 11/14. PT eval completed with ST SNF recommended. CM discussed recommendations with the patients son, with son agreeable to [place] SN[facility] prior to the patient transitioning home. CMS SNF compare list discussed with preference for a ST SNF in [place] or [place]. FL2/PASRR complete and faxed to the requested facilities for review. CM explained the SNF process, pending bed availability and insurance auth, with the son verbalizing understanding. CM team will continue to follow.   Expected Discharge Plan: Skilled Nursing Facility Barriers to Discharge: Continued Medical Work up, SNF Pending bed offer, Insurance Authorization   Patient Goals and CMS Choice Patient states their goals for this hospitalization and ongoing recovery are:: patient unable to state her goals d/t mental status CMS Medicare.gov Compare Post Acute Care list provided to:: Patient Represenative (must comment)(Ernest Rosanna Randy) Choice offered to / list presented to : Adult Children(Ernest Rosanna Randy)  Expected Discharge Plan and Services Expected Discharge Plan: [place] In-house Referral: Clinical Social Work Discharge Planning Services: CM Consult Post Acute Care Choice: [place] arrangements for the past 2 months: Single Family Home                 DME Arranged: N/A DME Agency: NA       HH Arranged: NA [place] Agency: NA        Prior Living  Arrangements/Services Living arrangements for the past 2 months: [place] Lives with:: Self, Adult Children Patient language and need for interpreter reviewed:: Yes Do you feel safe going back to the place where you live?: Yes(post-rehab)      Need for Family Participation in Patient Care: Yes (Comment) Care giver support system in place?: Yes (comment) Current home services: DME Criminal Activity/Legal Involvement Pertinent to Current Situation/Hospitalization: No - Comment as needed  Activities of Daily Living Home Assistive Devices/Equipment: Walker (specify type) ADL Screening (condition at time of admission) Patient's cognitive ability adequate to safely complete daily activities?: No Is the patient deaf or have difficulty hearing?: Yes Does the patient have difficulty seeing, even when wearing glasses/contacts?: Yes Does the patient have difficulty concentrating, remembering, or making decisions?: Yes Patient able to express need for assistance with ADLs?: Yes Does the patient have difficulty dressing or bathing?: Yes Independently performs ADLs?: No Does the patient have difficulty walking or climbing stairs?: Yes Weakness of Legs: Both Weakness of Arms/Hands: Both  Permission Sought/Granted Permission sought to share information with : Case Manager, Chartered certified accountant granted to share information with : Yes, Verbal Permission Granted     Permission granted to share info w AGENCY: SNF facilities        Emotional Assessment       Orientation: : Oriented to Self Alcohol / Substance Use: Not Applicable Psych Involvement: No (comment)  Admission diagnosis:  Hypokalemia [E87.6] Closed displaced intertrochanteric fracture of left femur, initial encounter ([place]) [S72.142A] Closed nondisplaced fracture  of neck of fifth metacarpal bone of left hand, initial encounter [S62.367A] Fall, initial encounter [W19.XXXA] Other closed nondisplaced  fracture of proximal end of left humerus, initial encounter [S42.295A] Patient Active Problem List   Diagnosis Date Noted  . Nonrheumatic aortic valve stenosis   . Closed left hip fracture ([place]) 01/05/2019  . Fall at home, initial encounter 01/05/2019  . Closed fracture of proximal end of left humerus 01/05/2019  . Unsp fracture of fifth metacarpal bone, left hand, init 01/05/2019  . Osteopenia 01/05/2019  . Hypokalemia 01/05/2019   PCP:  System, Provider Not In Pharmacy:  No Pharmacies Listed    Social Determinants of Health (SDOH) Interventions    Readmission Risk Interventions No flowsheet data found.

## 2019-01-09 NOTE — NC FL2 (Addendum)
[place] LEVEL OF CARE SCREENING TOOL     IDENTIFICATION  Patient Name: Ann Garza Birthdate: Nov 26, 1922 Sex: female Admission Date (Current Location): 01/05/2019  [facility] and [place] Number:  [employer] and Address:  [facility], [place] [address] [place], [place] [address]      Provider Number: [id number]  Attending Physician Name and Address:  Guilford Shi, MD  Relative Name and Phone Number:  Hajira Seehafer [phone]    Current Level of Care: Hospital Recommended Level of Care: [place] Prior Approval Number:    Date Approved/Denied:   PASRR Number: [id number]  Discharge Plan: SNF    Current Diagnoses: Patient Active Problem List   Diagnosis Date Noted  . Nonrheumatic aortic valve stenosis   . Closed left hip fracture ([place]) 01/05/2019  . Fall at home, initial encounter 01/05/2019  . Closed fracture of proximal end of left humerus 01/05/2019  . Unsp fracture of fifth metacarpal bone, left hand, init 01/05/2019  . Osteopenia 01/05/2019  . Hypokalemia 01/05/2019    Orientation RESPIRATION BLADDER Height & Weight     Self  Normal External catheter, Incontinent Weight: 41.7 kg Height:  4\' 11"  (149.9 cm)  BEHAVIORAL SYMPTOMS/MOOD NEUROLOGICAL BOWEL NUTRITION STATUS  Other (Comment)(N/A) (N/A) Continent Diet(regular)  AMBULATORY STATUS COMMUNICATION OF NEEDS Skin   Extensive Assist Verbally Surgical wounds(Left hip)                       Personal Care Assistance Level of Assistance  Bathing, Feeding, Dressing, Total care Bathing Assistance: Maximum assistance Feeding assistance: Limited assistance Dressing Assistance: Maximum assistance Total Care Assistance: Maximum assistance   Functional Limitations Info  Sight, Hearing, Speech Sight Info: Adequate(adequate for age) Hearing Info: Impaired Speech Info: Adequate    SPECIAL CARE FACTORS FREQUENCY  PT (By licensed PT)      PT Frequency: Min 2X/week OT Frequency: eval pending            Contractures Contractures Info: Not present    Additional Factors Info  Code Status, Allergies Code Status Info: DNR Allergies Info: NKDA           Current Medications (01/09/2019):  This is the current hospital active medication list Current Facility-Administered Medications  Medication Dose Route Frequency Provider Last Rate Last Dose  . 0.9 %  sodium chloride infusion   Intravenous Continuous Thornell Mule, MD 50 mL/hr at 01/07/19 1518    . docusate sodium (COLACE) capsule 100 mg  100 mg Oral BID Rod Can, MD   100 mg at 01/08/19 2335  . enoxaparin (LOVENOX) injection 30 mg  30 mg Subcutaneous Q24H Swinteck, Aaron Edelman, MD   30 mg at 01/09/19 0618  . haloperidol lactate (HALDOL) injection 1 mg  1 mg Intramuscular Q6H PRN Thornell Mule, MD   1 mg at 01/08/19 0509  . HYDROcodone-acetaminophen (NORCO/VICODIN) 5-325 MG per tablet 1-2 tablet  1-2 tablet Oral Q6H PRN Rod Can, MD   1 tablet at 01/08/19 0031  . menthol-cetylpyridinium (CEPACOL) lozenge 3 mg  1 lozenge Oral PRN Swinteck, Aaron Edelman, MD       Or  . phenol (CHLORASEPTIC) mouth spray 1 spray  1 spray Mouth/Throat PRN Swinteck, Aaron Edelman, MD      . metoCLOPramide (REGLAN) tablet 5-10 mg  5-10 mg Oral Q8H PRN Swinteck, Aaron Edelman, MD       Or  . metoCLOPramide (REGLAN) injection 5-10 mg  5-10 mg Intravenous Q8H PRN Swinteck, Aaron Edelman,  MD      . morphine 2 MG/ML injection 0.5 mg  0.5 mg Intravenous Q2H PRN Rod Can, MD   0.5 mg at 01/08/19 2336  . OLANZapine (ZYPREXA) tablet 5 mg  5 mg Oral Daily PRN Thornell Mule, MD      . ondansetron [facility]) tablet 4 mg  4 mg Oral Q6H PRN Swinteck, Aaron Edelman, MD       Or  . ondansetron (ZOFRAN) injection 4 mg  4 mg Intravenous Q6H PRN Rod Can, MD         Discharge Medications: Please see discharge summary for a list of discharge medications.  Relevant Imaging Results:  Relevant Lab Results:   Additional  Information SSN: [id number]  [place], BSN, NCM-BC, ACM-RN [phone]

## 2019-01-10 LAB — TYPE AND SCREEN
ABO/RH(D): O NEG
Antibody Screen: POSITIVE
Unit division: 0
Unit division: 0
Unit division: 0
Unit division: 0

## 2019-01-10 LAB — CBC
HCT: 25.2 % — ABNORMAL LOW (ref 36.0–46.0)
Hemoglobin: 8.1 g/dL — ABNORMAL LOW (ref 12.0–15.0)
MCH: 29.9 pg (ref 26.0–34.0)
MCHC: 32.1 g/dL (ref 30.0–36.0)
MCV: 93 fL (ref 80.0–100.0)
Platelets: 185 10*3/uL (ref 150–400)
RBC: 2.71 MIL/uL — ABNORMAL LOW (ref 3.87–5.11)
RDW: 14.6 % (ref 11.5–15.5)
WBC: 6.5 10*3/uL (ref 4.0–10.5)
nRBC: 0.3 % — ABNORMAL HIGH (ref 0.0–0.2)

## 2019-01-10 LAB — IRON AND TIBC
Iron: 23 ug/dL — ABNORMAL LOW (ref 28–170)
Saturation Ratios: 13 % (ref 10.4–31.8)
TIBC: 175 ug/dL — ABNORMAL LOW (ref 250–450)
UIBC: 152 ug/dL

## 2019-01-10 LAB — BPAM RBC
Blood Product Expiration Date: 202012082359
Blood Product Expiration Date: 202012082359
Blood Product Expiration Date: 202012092359
Blood Product Expiration Date: 202012092359
ISSUE DATE / TIME: 202011132028
ISSUE DATE / TIME: 202011132355
Unit Type and Rh: 9500
Unit Type and Rh: 9500
Unit Type and Rh: 9500
Unit Type and Rh: 9500

## 2019-01-10 LAB — SARS CORONAVIRUS 2 (TAT 6-24 HRS): SARS Coronavirus 2: NEGATIVE

## 2019-01-10 LAB — FERRITIN: Ferritin: 181 ng/mL (ref 11–307)

## 2019-01-10 MED ORDER — ASPIRIN EC 81 MG PO TBEC
81.0000 mg | DELAYED_RELEASE_TABLET | Freq: Every day | ORAL | Status: DC
  Administered 2019-01-10 – 2019-01-11 (×2): 81 mg via ORAL
  Filled 2019-01-10 (×2): qty 1

## 2019-01-10 MED ORDER — METOPROLOL TARTRATE 25 MG PO TABS
12.5000 mg | ORAL_TABLET | Freq: Two times a day (BID) | ORAL | Status: DC
  Administered 2019-01-10 – 2019-01-11 (×3): 12.5 mg via ORAL
  Filled 2019-01-10 (×3): qty 1

## 2019-01-10 NOTE — TOC Progression Note (Signed)
Transition of Care [facility]) - Progression Note    Patient Details  Name: Ann Garza MRN: [id number] Date of Birth: 1922-11-29  Transition of Care [facility]) CM/SW [place] RN, BSN, NCM-BC, [place] [phone] Phone Number: 01/10/2019, 2:27 PM  Clinical Narrative:    CM spoke to the patients son, Jaquelyn Bitter and his spouse to discuss bed offers, with [facility] [place] selected. Patient will require insurance auth, which can take 1-3 days. CM team will continue to follow.   Expected Discharge Plan: [place] Barriers to Discharge: Continued Medical Work up, SNF Pending bed offer, Ship broker  Expected Discharge Plan and Services Expected Discharge Plan: [place] In-house Referral: Clinical Social Work Discharge Planning Services: CM Consult Post Acute Care Choice: [place] arrangements for the past 2 months: Single Family Home                 DME Arranged: N/A DME Agency: NA       HH Arranged: NA HH Agency: NA         Social Determinants of Health (SDOH) Interventions    Readmission Risk Interventions No flowsheet data found.

## 2019-01-10 NOTE — Progress Notes (Addendum)
PROGRESS NOTE    Ann Garza  [id number]  DOB: Jun 19, 1922  DOA: 01/05/2019 PCP: System, Provider Not In  Brief Narrative:  85 y.o.F with h/o HTN, CAD, hearing loss, osteoporosis,compression fracture of T8 vertebra, recurrent falls, presented to [facility]  afte suffering a fall. Her most recent fall was on 11/2 when she was evaluated at [facility] and was found to have a left humerus fracture. Fell again 11/12 resulting in L hip fracture. Transferred to [facility] as felt to be high operative risk. Lab work showed AKI. Hospital course: After getting Cardiac clearance, had L hip IM nail on 11/14, WBAT. Seen by PT who recommends SNF.Renal function improved with IVF, remains on gentle hydration as her PO intake is poor   Subjective:  Patient seen earlier in rounds. She is very hard of hearing but oriented x3. States is eating good and not in pain.  Postop day 3 after IM nailing.  Objective: Vitals:   01/09/19 1732 01/09/19 2022 01/10/19 0340 01/10/19 0757  BP: 128/61 (!) 143/85 (!) 156/70 (!) 169/68  Pulse: 91 (!) 52 (!) 104 99  Resp: 18 18 15 18   Temp: 98.7 F (37.1 C) 98.8 F (37.1 C) 98.7 F (37.1 C) 98.8 F (37.1 C)  TempSrc: Oral Oral Oral Oral  SpO2: 95% 96% 97% 98%  Weight:      Height:        Intake/Output Summary (Last 24 hours) at 01/10/2019 1338 Last data filed at 01/10/2019 0900 Gross per 24 hour  Intake 240 ml  Output 1300 ml  Net -1060 ml   Filed Weights   01/05/19 1415 01/06/19 0957  Weight: 41.7 kg 41.7 kg    Physical Examination:  General exam: Appears calm and comfortable  Respiratory system: Clear to auscultation. Respiratory effort normal. Cardiovascular system: S1 & S2 heard, RRR.  Systolic murmur, no JVD. No pedal edema. Gastrointestinal system: Abdomen is nondistended, soft and nontender. No organomegaly or masses felt. Normal bowel sounds heard. Central nervous system: Alert and oriented. No focal neurological deficits.  Extremities: Mild pain with range of motion along left lower extremity. Skin: No rashes, lesions or ulcers Psychiatry: Judgement and insight appear normal. Mood & affect appropriate.     Data Reviewed: I have personally reviewed following labs and imaging studies  CBC: Recent Labs  Lab 01/05/19 1155 01/06/19 1158 01/07/19 0525 01/08/19 0620 01/08/19 2007 01/09/19 0441 01/10/19 0347  WBC 8.9 8.2  --  7.4  --  6.2 6.5  NEUTROABS 7.8*  --   --   --   --   --   --   HGB 10.2* 8.0* 11.2* 8.6* 8.1* 8.1* 8.1*  HCT 33.0* 25.4* 33.0* 25.8* 24.8* 25.0* 25.2*  MCV 97.1 96.2  --  89.0  --  92.6 93.0  PLT 244 212  --  137*  --  142* [id number]   Basic Metabolic Panel: Recent Labs  Lab 01/05/19 1155 01/05/19 1500 01/06/19 1158 01/08/19 0620 01/09/19 0441  NA 145  --  142 141 142  K 3.0*  --  4.8 3.7 3.4*  CL 108  --  107 108 110  CO2 28  --  18* 21* 23  GLUCOSE 135*  --  103* 140* 102*  BUN 13  --  18 14 11   CREATININE 0.58  --  1.01* 0.90 0.71  CALCIUM 9.2  --  8.8* 7.9* 7.7*  MG  --  1.9  --   --   --  GFR: Estimated Creatinine Clearance: 28.3 mL/min (by C-G formula based on SCr of 0.71 mg/dL). Liver Function Tests: No results for input(s): AST, ALT, ALKPHOS, BILITOT, PROT, ALBUMIN in the last 168 hours. No results for input(s): LIPASE, AMYLASE in the last 168 hours. No results for input(s): AMMONIA in the last 168 hours. Coagulation Profile: Recent Labs  Lab 01/05/19 1155  INR 1.1   Cardiac Enzymes: No results for input(s): CKTOTAL, CKMB, CKMBINDEX, TROPONINI in the last 168 hours. BNP (last 3 results) No results for input(s): PROBNP in the last 8760 hours. HbA1C: No results for input(s): HGBA1C in the last 72 hours. CBG: No results for input(s): GLUCAP in the last 168 hours. Lipid Profile: No results for input(s): CHOL, HDL, LDLCALC, TRIG, CHOLHDL, LDLDIRECT in the last 72 hours. Thyroid Function Tests: No results for input(s): TSH, T4TOTAL, FREET4, T3FREE,  THYROIDAB in the last 72 hours. Anemia Panel: Recent Labs    01/09/19 0441 01/10/19 0347  VITAMINB12 620  --   FOLATE 23.2  --   FERRITIN 228 181  TIBC 169* 175*  IRON 14* 23*   Sepsis Labs: No results for input(s): PROCALCITON, LATICACIDVEN in the last 168 hours.  Recent Results (from the past 240 hour(s))  SARS CORONAVIRUS 2 (TAT 6-24 HRS) Nasopharyngeal Nasopharyngeal Swab     Status: None   Collection Time: 01/05/19 11:37 AM   Specimen: Nasopharyngeal Swab  Result Value Ref Range Status   SARS Coronavirus 2 NEGATIVE NEGATIVE Final    Comment: (NOTE) SARS-CoV-2 target nucleic acids are NOT DETECTED. The SARS-CoV-2 RNA is generally detectable in upper and lower respiratory specimens during the acute phase of infection. Negative results do not preclude SARS-CoV-2 infection, do not rule out co-infections with other pathogens, and should not be used as the sole basis for treatment or other patient management decisions. Negative results must be combined with clinical observations, patient history, and epidemiological information. The expected result is Negative. Fact Sheet for Patients: SugarRoll.be Fact Sheet for Healthcare Providers: https://www.woods-mathews.com/ This test is not yet approved or cleared by the [place] FDA and  has been authorized for detection and/or diagnosis of SARS-CoV-2 by FDA under an Emergency Use Authorization (EUA). This EUA will remain  in effect (meaning this test can be used) for the duration of the COVID-19 declaration under Section 56 4(b)(1) of the Act, 21 U.S.C. section 360bbb-3(b)(1), unless the authorization is terminated or revoked sooner. Performed at [facility], [place] [address], [place], [place] [address]   MRSA PCR Screening     Status: None   Collection Time: 01/06/19  6:33 AM   Specimen: Nasal Mucosa; Nasopharyngeal  Result Value Ref Range Status   MRSA by PCR NEGATIVE  NEGATIVE Final    Comment:        The GeneXpert MRSA Assay (FDA approved for NASAL specimens only), is one component of a comprehensive MRSA colonization surveillance program. It is not intended to diagnose MRSA infection nor to guide or monitor treatment for MRSA infections. Performed at [facility], [place] [address], [place],  [address]       Radiology Studies: No results found.      Scheduled Meds: . calcium-vitamin D  1 tablet Oral Q breakfast  . docusate sodium  100 mg Oral BID  . enoxaparin (LOVENOX) injection  30 mg Subcutaneous Q24H   Continuous Infusions:   Assessment & Plan:    1.  Recurrent falls, left hip fracture: Postop day 2, s/p intramedullary nailing by  orthopedics.  Tolerated procedure well.  Off Foley catheter.  On DVT prophylaxis.  Weightbearing as tolerated per orthopedics.  Pain medications as needed.  Delirium precautions.  Encourage oral fluid intake.  DCed IV fluids. Check orthostatics as possible cause for falls.  2.?  History of hypertension/CAD:  ?Not on any medications at home. Patient denies taking asa but states takes a BP med.  Currently SBP 120-160s without any medications. Will add low dose b-blocker and asa 81 mg.  Continue to monitor.  Echo in this admission showed stage I diastolic dysfunction and moderate aortic stenosis.  3.  Mild hypokalemia: Replace as needed  4.  Osteoporosis/compression fracture:? Not on any medications at home. Started on vitamin D/calcium.      5.  Recent fall with left humerus/left fifth MC fracture: Evaluated at [facility].  Nonweightbearing to left upper extremity for now. Has sling and soft cast in place  6.  Acute on chronic anemia: Hemoglobin fluctuating between 8-10.  No signs of acute bleeding.  Check iron studies.  Likely anemia of chronic disease with some acute blood loss component in the setting of problem #1.    7. Age-related hearing loss: No acute intervention needed    DVT prophylaxis: Lovenox while here--may need to go on ASA on discharge for 4 weeks. Sent Epic message to Ortho APP to advise--no response yet Code Status: DNR Family / Patient Communication: Discussed with son at bedside yesterday. Disposition Plan: Patient apparently was living alone until recently when she moved in with son.  Son at bedside today and agrees with PT recommendations for skilled nursing facility. CM working on options. Repeat COVID screen sent this morning.     LOS: 5 days    Time spent: 25 minutes    Guil[place] Shi, MD [facility] Pager [phone]  If 7PM-7AM, please contact night-coverage [email or web address] Password [email or web address] 01/10/2019, 1:38 PM

## 2019-01-10 NOTE — Plan of Care (Signed)
  Problem: Pain Management: Goal: Pain level will decrease Outcome: Progressing   Problem: Activity: Goal: Risk for activity intolerance will decrease Outcome: Progressing   Problem: Nutrition: Goal: Adequate nutrition will be maintained Outcome: Progressing   Problem: Coping: Goal: Level of anxiety will decrease Outcome: Progressing   Problem: Pain Managment: Goal: General experience of comfort will improve Outcome: Progressing   Problem: Safety: Goal: Ability to remain free from injury will improve Outcome: Progressing   Problem: Skin Integrity: Goal: Risk for impaired skin integrity will decrease Outcome: Progressing

## 2019-01-10 NOTE — Evaluation (Addendum)
Occupational Therapy Evaluation Patient Details Name: Ann Garza MRN: [id number] DOB: 1922/08/20 Today's Date: 01/10/2019    History of Present Illness Ann Garza is a 85 y.o. female with medical history significant of falls, is brought to the hospital today after suffering a fall.  Her most recent fall was on 11/2 when she was evaluated at [facility] and was found to have a left humerus fracture. Fell again 11/12 resulting in L hip fracture; after getting Cardiac clearance, had L hip IM nail on 11/14, WBAT;  has a past medical history of Anemia, Cancer ([place]), Coronary artery disease, Elevated glucose, Hearing loss, Heart murmur, Hypercalcemia, Hypertension, MI, old, Osteoporosis, and Wedge compression fracture of T8 vertebra ([place]).   Clinical Impression   Pt with decline in function and safety with ADLs and ADL mobility with impaired strength, balance, endurance, R UE ROM/function. R UE in sling and being treated as NWB until further clarification. Pt a poor historian as far as PLOF but dies stats that she lives ta home with her children. Pt currently requires max A with LB ADLs, total A with LB ADLs, total A with toileting and max A +2 for transfers. Pt would benefit form acute OT services to address impairments to maximize level of function and safety    Follow Up Recommendations  SNF;Supervision/Assistance - 24 hour    Equipment Recommendations  3 in 1 bedside commode;Other (comment)(TBD at next venue of care)    Recommendations for Other Services       Precautions / Restrictions Precautions Precautions: Fall Restrictions Weight Bearing Restrictions: Yes LUE Weight Bearing: Non weight bearing LLE Weight Bearing: Weight bearing as tolerated Other Position/Activity Restrictions: Sling for LUE  Noted a documented L humerus fx in the Ortho Consult on 11/13, but apparently pt went to [facility] bed mobility: Needs  Assistance Bed Mobility: Sit to Supine     Supine to sit: Max assist Sit to supine: Total assist;+2 for physical assistance   General bed mobility comments: pt in recliner upon arrival, NT and OT assisted pt back to bed total A  Transfers Overall transfer level: Needs assistance Equipment used: 2 person hand held assist Transfers: Sit to/from Stand Sit to Stand: Max assist;+2 physical assistance Stand pivot transfers: Max assist;+2 physical assistance       General transfer comment: Light mod assist to power up to stand    Balance Overall balance assessment: Needs assistance Sitting-balance support: Feet unsupported Sitting balance-Leahy Scale: Fair                                     ADL either performed or assessed with clinical judgement   ADL Overall ADL's : Needs assistance/impaired Eating/Feeding: Minimal assistance;Set up;Sitting   Grooming: Wash/dry hands;Wash/dry face;Sitting;Minimal assistance   Upper Body Bathing: Maximal assistance   Lower Body Bathing: Total assistance   Upper Body Dressing : Maximal assistance   Lower Body Dressing: Total assistance   Toilet Transfer: Maximal assistance;+2 for physical assistance;Stand-pivot   Toileting- Clothing Manipulation and Hygiene: Total assistance       Functional mobility during ADLs: Maximal assistance;Moderate assistance;+2 for physical assistance       Vision Patient Visual Report: No change from baseline       Perception     Praxis      Pertinent Vitals/Pain Pain Assessment: Faces Faces Pain Scale: Hurts  little more Pain Location: L hip/thigh; she didn't mention her LUE much re: pain Pain Descriptors / Indicators: Grimacing;Guarding Pain Intervention(s): Limited activity within patient's tolerance;Monitored during session;Repositioned     Hand Dominance Right   Extremity/Trunk Assessment Upper Extremity Assessment Upper Extremity Assessment: Generalized weakness;LUE  deficits/detail LUE Deficits / Details: l UE in ace wrap and sling; kept NWB until further clarification   Lower Extremity Assessment Lower Extremity Assessment: Defer to PT evaluation   Cervical / Trunk Assessment Cervical / Trunk Assessment: Kyphotic   Communication Communication Communication: HOH   Cognition Arousal/Alertness: Awake/alert Behavior During Therapy: WFL for tasks assessed/performed Overall Cognitive Status: Within Functional Limits for tasks assessed                                     General Comments       Exercises     Shoulder Instructions      Home Living Family/patient expects to be discharged to:: Skilled nursing facility Living Arrangements: Children                               Additional Comments: Difficult historian and family unavailable      Prior Functioning/Environment          Comments: Unable to discern PLOF info from patient        OT Problem List: Decreased strength;Impaired balance (sitting and/or standing);Pain;Decreased safety awareness;Decreased activity tolerance;Decreased knowledge of use of DME or AE;Impaired UE functional use      OT Treatment/Interventions: Self-care/ADL training;DME and/or AE instruction;Therapeutic activities;Therapeutic exercise;Patient/family education    OT Goals(Current goals can be found in the care plan section) Acute Rehab OT Goals Patient Stated Goal: none stated OT Goal Formulation: With patient Time For Goal Achievement: 01/24/19 Potential to Achieve Goals: Good ADL Goals Pt Will Perform Grooming: with min guard assist;sitting Pt Will Perform Upper Body Bathing: with mod assist;sitting Pt Will Perform Upper Body Dressing: with mod assist;sitting Pt Will Transfer to Toilet: with max assist;with mod assist;stand pivot transfer;bedside commode  OT Frequency: Min 2X/week   Barriers to D/C: Decreased caregiver support          Co-evaluation               AM-PAC OT "6 Clicks" Daily Activity     Outcome Measure Help from another person eating meals?: A Little Help from another person taking care of personal grooming?: A Little Help from another person toileting, which includes using toliet, bedpan, or urinal?: Total Help from another person bathing (including washing, rinsing, drying)?: Total Help from another person to put on and taking off regular upper body clothing?: A Lot Help from another person to put on and taking off regular lower body clothing?: Total 6 Click Score: 11   End of Session Equipment Utilized During Treatment: Gait belt  Activity Tolerance: Patient limited by fatigue Patient left: in bed;with call bell/phone within reach;with bed alarm set  OT Visit Diagnosis: Unsteadiness on feet (R26.81);Other abnormalities of gait and mobility (R26.89);Muscle weakness (generalized) (M62.81);History of falling (Z91.81);Pain Pain - Right/Left: Left Pain - part of body: Hip;Arm;Hand                Time: [id number] OT Time Calculation (min): 26 min Charges:  OT General Charges $OT Visit: 1 Visit OT Evaluation $OT Eval Moderate Complexity: 1 Mod OT Treatments $Self Care/Home Management :  8-22 mins    Emmit Alexanders [employer] 01/10/2019, 3:15 PM

## 2019-01-10 NOTE — Progress Notes (Signed)
Physical Therapy Treatment Patient Details Name: Ann Garza MRN: [id number] DOB: 1922/04/11 Today's Date: 01/10/2019    History of Present Illness Ann Garza is a 85 y.o. female with medical history significant of falls, is brought to the hospital today after suffering a fall.  Her most recent fall was on 11/2 when she was evaluated at [facility] and was found to have a left humerus fracture. Fell again 11/12 resulting in L hip fracture; after getting Cardiac clearance, had L hip IM nail on 11/14, WBAT;  has a past medical history of Anemia, Cancer ([place]), Coronary artery disease, Elevated glucose, Hearing loss, Heart murmur, Hypercalcemia, Hypertension, MI, old, Osteoporosis, and Wedge compression fracture of T8 vertebra ([place]).    PT Comments    Continuing work on functional mobility and activity tolerance;  Able to take a few pivotal steps bed to chair with +2 assist and bil support at gait belt; Painful LLE made it hard to accept weight into single limb stance, leading to very short R steps; Still, good participation, and progress noted; Communicated with Ambrose [place], Ortho Trauma PA, who advised [place] to keep LUE NWB in sling; Overall progressing well; Anticipate continuing good progress at post-acute rehabilitation.   Follow Up Recommendations  SNF     Equipment Recommendations  Other (comment)(to be determined)    Recommendations for Other Services       Precautions / Restrictions Precautions Precautions: Fall Restrictions LUE Weight Bearing: Non weight bearing LLE Weight Bearing: Weight bearing as tolerated Other Position/Activity Restrictions: Sling for LUE    Mobility  Bed Mobility Overal bed mobility: Needs Assistance Bed Mobility: Supine to Sit     Supine to sit: Max assist     General bed mobility comments: Noting More effort in moving LEs towards EOB; still Max assist, but only needing 1 person assist  Transfers Overall transfer level:  Needs assistance Equipment used: 2 person hand held assist(and bil support at gait belt) Transfers: Sit to/from Stand Sit to Stand: +2 physical assistance;Mod assist         General transfer comment: Light mod assist to power up to stand  Ambulation/Gait Ambulation/Gait assistance: +2 physical assistance;Mod assist Gait Distance (Feet): (Pivotal steps bed to chair) Assistive device: 2 person hand held assist(and support at gait belt)       General Gait Details: Difficulty stepping RLE due to pain in L single limb stance; Heavy moderat bilateral assist for support   Stairs             Wheelchair Mobility    Modified Rankin (Stroke Patients Only)       Balance Overall balance assessment: Needs assistance                                          Cognition Arousal/Alertness: Awake/alert Behavior During Therapy: WFL for tasks assessed/performed Overall Cognitive Status: Within Functional Limits for tasks assessed(for simple mobility)                                        Exercises      General Comments        Pertinent Vitals/Pain Pain Assessment: Faces Faces Pain Scale: Hurts little more Pain Location: L hip/thigh; she didn't mention her LUE much re: pain Pain Descriptors /  Indicators: Grimacing Pain Intervention(s): Monitored during session    Home Living                      Prior Function            PT Goals (current goals can now be found in the care plan section) Acute Rehab PT Goals Patient Stated Goal: Did not state, but agreeable to OOB PT Goal Formulation: With patient Time For Goal Achievement: 01/22/19 Potential to Achieve Goals: Good Progress towards PT goals: Progressing toward goals    Frequency    Min 2X/week      PT Plan Current plan remains appropriate    Co-evaluation              AM-PAC PT "6 Clicks" Mobility   Outcome Measure  Help needed turning from your back to  your side while in a flat bed without using bedrails?: A Lot Help needed moving from lying on your back to sitting on the side of a flat bed without using bedrails?: A Lot Help needed moving to and from a bed to a chair (including a wheelchair)?: A Lot Help needed standing up from a chair using your arms (e.g., wheelchair or bedside chair)?: A Lot Help needed to walk in hospital room?: Total Help needed climbing 3-5 steps with a railing? : Total 6 Click Score: 10    End of Session Equipment Utilized During Treatment: Gait belt(Sling) Activity Tolerance: Patient tolerated treatment well Patient left: in chair;with call bell/phone within reach;with chair alarm set Nurse Communication: Mobility status PT Visit Diagnosis: Unsteadiness on feet (R26.81);Other abnormalities of gait and mobility (R26.89);Repeated falls (R29.6);Muscle weakness (generalized) (M62.81);History of falling (Z91.81);Adult, failure to thrive (R62.7)     Time: [id number] PT Time Calculation (min) (ACUTE ONLY): 18 min  Charges:  $Therapeutic Activity: 8-22 mins                     Roney Marion, PT  Acute Rehabilitation Services Pager [phone] Office [place] 01/10/2019, 2:14 PM

## 2019-01-11 DIAGNOSIS — Y92009 Unspecified place in unspecified non-institutional (private) residence as the place of occurrence of the external cause: Secondary | ICD-10-CM

## 2019-01-11 LAB — VITAMIN D 25 HYDROXY (VIT D DEFICIENCY, FRACTURES): Vit D, 25-Hydroxy: 25.4 ng/mL — ABNORMAL LOW (ref 30–100)

## 2019-01-11 MED ORDER — LABETALOL HCL 5 MG/ML IV SOLN: 10.0000 mg | INTRAVENOUS | Status: DC | PRN

## 2019-01-11 MED ORDER — OLANZAPINE 5 MG PO TABS: 5.0000 mg | ORAL_TABLET | Freq: Every day | ORAL | Status: AC

## 2019-01-11 MED ORDER — ACETAMINOPHEN 325 MG PO TABS: 650.0000 mg | ORAL_TABLET | Freq: Four times a day (QID) | ORAL | Status: AC | PRN

## 2019-01-11 MED ORDER — LOSARTAN POTASSIUM 50 MG PO TABS
50.0000 mg | ORAL_TABLET | Freq: Every day | ORAL | Status: DC
  Administered 2019-01-11: 50 mg via ORAL
  Filled 2019-01-11: qty 1

## 2019-01-11 MED ORDER — POTASSIUM CHLORIDE CRYS ER 20 MEQ PO TBCR
40.0000 meq | EXTENDED_RELEASE_TABLET | Freq: Once | ORAL | Status: AC
  Administered 2019-01-11: 40 meq via ORAL
  Filled 2019-01-11: qty 2

## 2019-01-11 MED ORDER — LOSARTAN POTASSIUM 50 MG PO TABS: 50.0000 mg | ORAL_TABLET | Freq: Every day | ORAL | Status: AC

## 2019-01-11 MED ORDER — ENOXAPARIN SODIUM 30 MG/0.3ML ~~LOC~~ SOLN: 30.0000 mg | SUBCUTANEOUS | Status: AC

## 2019-01-11 MED ORDER — ASPIRIN 81 MG PO TBEC: 81.0000 mg | DELAYED_RELEASE_TABLET | Freq: Every day | ORAL | Status: AC

## 2019-01-11 NOTE — Progress Notes (Signed)
[facility] PROGRESS NOTE  Ann Garza [id number] DOB: 09/18/1922 DOA: 01/05/2019 PCP: System, Provider Not In   Subjective  No complaints, no fever or chills. Patient appears to be without acute distress and not complaining about pain. Status post left hip IM nail on 11/14.  WBAT Blood pressure elevated will restart home medications. Appears to be ready for discharge awaiting insurance authorization.  Brief history  85 y.o.F with h/o HTN, CAD, hearing loss, osteoporosis,compression fracture of T8 vertebra, recurrentfalls, presented to [facility] suffering a fall. Her most recent fall was on 11/2 when she was evaluated at [facility] and was found to have a left humerus fracture. Fell again 11/12 resulting in L hip fracture. Transferred to [facility] as felt to be high operative risk. Lab work showed AKI. Hospital course:After getting Cardiac clearance, had L hip IM nail on 11/14, WBAT. Seen by PT who recommends SNF.Renal function improved with IVF, remains ongentle hydration as her PO intake is poor  Assessment and plan   Principal Problem:   Closed left hip fracture [facility]) Active Problems:   Fall at home, initial encounter   Closed fracture of proximal end of left humerus   Unsp fracture of fifth metacarpal bone, left hand, init   Osteopenia   Hypokalemia   Nonrheumatic aortic valve stenosis    Recurrent falls, left hip fracture:  Surgery done on 11/14, s/p intramedullary nailing by orthopedics.   Tolerated procedure well.  Off Foley catheter.   On DVT prophylaxis.  Weightbearing as tolerated per orthopedics.   Pain medications as needed.  Delirium precautions.  History of hypertension/CAD:  ?Not on any medications at home.  Currently SBP 120-150s without any medications.  Continue to monitor.  Control pain.  DC IV fluids.  Echo in this admission showed stage I diastolic dysfunction and moderate aortic stenosis.  Mild hypokalemia: Replace as  needed  Osteoporosis/compression fracture:? Not on any medications at home.  Will start on vitamin D/calcium.      Recent fall with left humerus/left fifth MC fracture: Evaluated at [facility].  Nonweightbearing to left upper extremity for now.  Acute on chronic anemia: Hemoglobin fluctuating between 8-10.  No signs of acute bleeding.  Check iron studies.  Likely anemia of chronic disease with some acute blood loss component in the setting of problem #1.    Age-related hearing loss: No acute intervention needed  Code Status: DNR Family Communication: Plan discussed with the patient. Disposition Plan: Remains inpatient Diet:  Diet Order            Diet regular Room service appropriate? Yes; Fluid consistency: Thin  Diet effective now              Consultants   ortho  Procedures  . IM femoral fracture fixation done on 11/14  Antibiotics   None   Objective   Vitals:   01/11/19 0448 01/11/19 1056  BP: (!) 180/79 (!) 180/79  Pulse: 100 92  Resp:    Temp: 98.3 F (36.8 C)   SpO2: 95%     Intake/Output Summary (Last 24 hours) at 01/11/2019 1228 Last data filed at 01/11/2019 0500 Gross per 24 hour  Intake 145 ml  Output 300 ml  Net -155 ml   Filed Weights   01/05/19 1415 01/06/19 0957  Weight: 41.7 kg 41.7 kg    Physical examination  General: Alert and awake, oriented x3, not in any acute distress. HEENT: anicteric sclera, pupils reactive to light and accommodation, EOMI  CVS: S1-S2 clear, no murmur rubs or gallops Chest: clear to auscultation bilaterally, no wheezing, rales or rhonchi Abdomen: soft nontender, nondistended, normal bowel sounds, no organomegaly Extremities: no cyanosis, clubbing or edema noted bilaterally Neuro: Cranial nerves II-XII intact, no focal neurological deficits  Reviewed data  Basic Metabolic Panel: Recent Labs  Lab 01/05/19 1155 01/05/19 1500 01/06/19 1158 01/08/19 0620 01/09/19 0441  NA 145  --  142 141 142   K 3.0*  --  4.8 3.7 3.4*  CL 108  --  107 108 110  CO2 28  --  18* 21* 23  GLUCOSE 135*  --  103* 140* 102*  BUN 13  --  18 14 11   CREATININE 0.58  --  1.01* 0.90 0.71  CALCIUM 9.2  --  8.8* 7.9* 7.7*  MG  --  1.9  --   --   --    Liver Function Tests: No results for input(s): AST, ALT, ALKPHOS, BILITOT, PROT, ALBUMIN in the last 168 hours. No results for input(s): LIPASE, AMYLASE in the last 168 hours. No results for input(s): AMMONIA in the last 168 hours. CBC: Recent Labs  Lab 01/05/19 1155 01/06/19 1158 01/07/19 0525 01/08/19 0620 01/08/19 2007 01/09/19 0441 01/10/19 0347  WBC 8.9 8.2  --  7.4  --  6.2 6.5  NEUTROABS 7.8*  --   --   --   --   --   --   HGB 10.2* 8.0* 11.2* 8.6* 8.1* 8.1* 8.1*  HCT 33.0* 25.4* 33.0* 25.8* 24.8* 25.0* 25.2*  MCV 97.1 96.2  --  89.0  --  92.6 93.0  PLT 244 212  --  137*  --  142* 185   Cardiac Enzymes: No results for input(s): CKTOTAL, CKMB, CKMBINDEX, TROPONINI in the last 168 hours. BNP (last 3 results) No results for input(s): BNP in the last 8760 hours.  ProBNP (last 3 results) No results for input(s): PROBNP in the last 8760 hours.  CBG: No results for input(s): GLUCAP in the last 168 hours.  Micro Recent Results (from the past 240 hour(s))  SARS CORONAVIRUS 2 (TAT 6-24 HRS) Nasopharyngeal Nasopharyngeal Swab     Status: None   Collection Time: 01/05/19 11:37 AM   Specimen: Nasopharyngeal Swab  Result Value Ref Range Status   SARS Coronavirus 2 NEGATIVE NEGATIVE Final    Comment: (NOTE) SARS-CoV-2 target nucleic acids are NOT DETECTED. The SARS-CoV-2 RNA is generally detectable in upper and lower respiratory specimens during the acute phase of infection. Negative results do not preclude SARS-CoV-2 infection, do not rule out co-infections with other pathogens, and should not be used as the sole basis for treatment or other patient management decisions. Negative results must be combined with clinical observations, patient  history, and epidemiological information. The expected result is Negative. Fact Sheet for Patients: SugarRoll.be Fact Sheet for Healthcare Providers: https://www.woods-mathews.com/ This test is not yet approved or cleared by the [place] FDA and  has been authorized for detection and/or diagnosis of SARS-CoV-2 by FDA under an Emergency Use Authorization (EUA). This EUA will remain  in effect (meaning this test can be used) for the duration of the COVID-19 declaration under Section 56 4(b)(1) of the Act, 21 U.S.C. section 360bbb-3(b)(1), unless the authorization is terminated or revoked sooner. Performed at [facility], [place] [address], [place], [place] [address]   MRSA PCR Screening     Status: None   Collection Time: 01/06/19  6:33 AM   Specimen: Nasal Mucosa; Nasopharyngeal  Result Value Ref Range Status   MRSA by PCR NEGATIVE NEGATIVE Final    Comment:        The GeneXpert MRSA Assay (FDA approved for NASAL specimens only), is one component of a comprehensive MRSA colonization surveillance program. It is not intended to diagnose MRSA infection nor to guide or monitor treatment for MRSA infections. Performed at [facility], [place] [address] [place], [place] [address]   SARS CORONAVIRUS 2 (TAT 6-24 HRS) Nasopharyngeal Nasopharyngeal Swab     Status: None   Collection Time: 01/10/19  9:17 AM   Specimen: Nasopharyngeal Swab  Result Value Ref Range Status   SARS Coronavirus 2 NEGATIVE NEGATIVE Final    Comment: (NOTE) SARS-CoV-2 target nucleic acids are NOT DETECTED. The SARS-CoV-2 RNA is generally detectable in upper and lower respiratory specimens during the acute phase of infection. Negative results do not preclude SARS-CoV-2 infection, do not rule out co-infections with other pathogens, and should not be used as the sole basis for treatment or other patient management decisions. Negative results must be combined  with clinical observations, patient history, and epidemiological information. The expected result is Negative. Fact Sheet for Patients: SugarRoll.be Fact Sheet for Healthcare Providers: https://www.woods-mathews.com/ This test is not yet approved or cleared by the [place] FDA and  has been authorized for detection and/or diagnosis of SARS-CoV-2 by FDA under an Emergency Use Authorization (EUA). This EUA will remain  in effect (meaning this test can be used) for the duration of the COVID-19 declaration under Section 56 4(b)(1) of the Act, 21 U.S.C. section 360bbb-3(b)(1), unless the authorization is terminated or revoked sooner. Performed at [facility], [place] [address] [place], [place] [address]      Radiological studies, reviewed  No results found.  Scheduled Meds: . aspirin EC  81 mg Oral Daily  . calcium-vitamin D  1 tablet Oral Q breakfast  . docusate sodium  100 mg Oral BID  . enoxaparin (LOVENOX) injection  30 mg Subcutaneous Q24H  . metoprolol tartrate  12.5 mg Oral BID   Continuous Infusions:   Time spent: 35 minutes  [facility] Pager [phone] If 7PM-7AM, please contact night-coverage at [email or web address], password [facility] 01/11/2019, 12:28 PM  LOS: 6 days

## 2019-01-11 NOTE — Discharge Summary (Signed)
HOSPITAL DISCHARGE SUMMARY  Ann Garza  MRN: [id number]  DOB:1922/12/06  Date of Admission: 01/05/2019 Date of Discharge: 01/11/2019         LOS: 6 days   Attending Physician:  Birdie Hopes  Patient's PCP:  System, Provider Not In  Consults: Treatment Team:  Lbcardiology, Michae Kava, MD Rod Can, MD   Discharge Diagnoses: Present on Admission: . Closed left hip fracture ([place]) . Closed fracture of proximal end of left humerus . Unsp fracture of fifth metacarpal bone, left hand, init . Osteopenia . Hypokalemia    Allergies as of 01/11/2019   No Known Allergies     Medication List    STOP taking these medications   fluconazole 150 MG tablet Commonly known as: DIFLUCAN     TAKE these medications   acetaminophen 325 MG tablet Commonly known as: Tylenol Take 2 tablets (650 mg total) by mouth every 6 (six) hours as needed for mild pain or moderate pain.   aspirin 81 MG EC tablet Take 1 tablet (81 mg total) by mouth daily. Start taking on: January 12, 2019   enoxaparin 30 MG/0.3ML injection Commonly known as: LOVENOX Inject 0.3 mLs (30 mg total) into the skin daily for 14 days. Start taking on: January 12, 2019   losartan 50 MG tablet Commonly known as: COZAAR Take 1 tablet (50 mg total) by mouth daily. Start taking on: January 12, 2019   multivitamin with minerals Tabs tablet Take 1 tablet by mouth daily.   mupirocin ointment 2 % Commonly known as: BACTROBAN Apply 1 application topically 3 (three) times daily.   OLANZapine 5 MG tablet Commonly known as: ZYPREXA Take 1 tablet (5 mg total) by mouth at bedtime.   OVER THE COUNTER MEDICATION See admin instructions. Nystatin-Hydrocortisone-Zinc Oxide-Lanolin-dermabase (1:1:1:1:1)  - Apply topically twice daily        Brief Admission History: 85 y.o.F with h/o HTN, CAD, hearing loss, osteoporosis,compression fracture of T8 vertebra, recurrentfalls, presented to [facility] suffering a fall.  Her most recent fall was on 11/2 when she was evaluated at [facility] and was found to have a left humerus fracture. Fell again 11/12 resulting in L hip fracture. Transferred to [facility] as felt to be high operative risk. Lab work showed AKI. Hospital course:After getting Cardiac clearance, had L hip IM nail on 11/14, WBAT. Seen by PT who recommends SNF.Renal function improved with IVF, remains ongentle hydration as her PO intake is poor  Hospital Course: Present on Admission: . Closed left hip fracture ([place]) . Closed fracture of proximal end of left humerus . Unsp fracture of fifth metacarpal bone, left hand, init . Osteopenia . Hypokalemia   Recurrent falls, left hip fracture: Surgery done on 11/14, s/p intramedullary nailing by orthopedics.  Tolerated procedure well. Off Foley catheter.  On DVT prophylaxis. Weightbearing as tolerated per orthopedics.  Pain is well controlled only with Tylenol. Discharged on Lovenox for 2 more weeks for postoperative DVT prophylaxis  History of hypertension/CAD: ?Not on any medications at home. Currently SBP 120-150s without any medications. Continue to monitor. Control pain. DC IV fluids. Echo in this admission showed stage I diastolic dysfunction and moderate aortic stenosis. Started on Losartan  Mild hypokalemia:Replace as needed  Osteoporosis/compression fracture: This likely secondary to age and decreased mobility  Recent fall with left humerus/left fifth MC fracture: Evaluated at [facility]. Nonweightbearing to left upper extremity for now.  Acute on chronic anemia: Hemoglobin fluctuating between 8-10. No signs of acute bleeding. Check  iron studies. Likely anemia of chronic disease with some acute blood loss component in the setting of problem #1.   Age  Day of Discharge BP (!) 180/79   Pulse 92   Temp 98.3 F (36.8 C) (Oral)   Resp 14   Ht 4\' 11"  (1.499 m)   Wt 41.7 kg   SpO2 95%    BMI 18.57 kg/m  Physical Exam:  General: Alert but HOH and not oriented HEENT: anicteric sclera, pupils equal reactive to light and accommodation  CVS: S1-S2 heard, no murmur rubs or gallops  Chest: clear to auscultation bilaterally, no wheezing rales or rhonchi  Abdomen: normal bowel sounds, soft, nontender, nondistended, no organomegaly  Neuro: Cranial nerves II-XII intact, no focal neurological deficits  Extremities: no cyanosis, no clubbing or edema noted bilaterally  No results found for this or any previous visit (from the past 24 hour(s)).  Disposition SNF   Follow-up Appts: Discharge Instructions    Diet - low sodium heart healthy   Complete by: As directed    Increase activity slowly   Complete by: As directed       Follow-up Information    S[place], Aaron Edelman, MD. Schedule an appointment as soon as possible for a visit in 2 weeks.   Specialty: Orthopedic Surgery Why: For wound re-check Contact information: [address] [place] [address] [phone]           I spent 40 minutes completing paperwork and coordinating discharge efforts.  Signed:  Birdie Hopes 01/11/2019, 1:48 PM

## 2019-01-11 NOTE — Progress Notes (Signed)
Report was called to [facility].  Report was given the [place] (nurse)  A discharge packet has been printed and will be sent with the patient via PTAR.

## 2019-01-11 NOTE — TOC Transition Note (Signed)
Transition of Care [facility]) - CM/SW Discharge Note   Patient Details  Name: Genay Dantoni MRN: [id number] Date of Birth: October 26, 1922  [facility]) CM/SW Contact:  Midge Minium RN, BSN, NCM-BC, ACM-RN [phone] Phone Number: 01/11/2019, 2:41 PM   Clinical Narrative:    Patient is medically stable to transition to [facility] for ST SNF with insurance auth obtained/bed is available. CM updated Jaquelyn Bitter and Kathrin Greathouse with both agreeable to discharge. PTAR arranged for 1530.  Please call report to: [phone]   Final next level of care: Skilled Nursing Facility Barriers to Discharge: No Barriers Identified   Patient Goals and CMS Choice Patient states their goals for this hospitalization and ongoing recovery are:: patient unable to state her goals d/t mental status CMS Medicare.gov Compare Post Acute Care list provided to:: Patient Represenative (must comment)(Ernest Rosanna Randy) Choice offered to / list presented to : Adult Children(Ernest Rosanna Randy)  Discharge Placement              Patient chooses bed at: Other - please specify in the comment section below:([facility]) Patient to be transferred to facility by: [place] Name of family member notified: Jaquelyn Bitter and Lindaann Ying Patient and family notified of of transfer: 01/11/19  Discharge Plan and Services In-house Referral: Clinical Social Work Discharge Planning Services: CM Consult Post Acute Care Choice: [facility]          DME Arranged: N/A DME Agency: NA       HH Arranged: NA HH Agency: NA        Social Determinants of Health (SDOH) Interventions     Readmission Risk Interventions No flowsheet data found.

## 2019-03-27 DEATH — deceased

## 2021-02-04 IMAGING — CT CT HEAD W/O CM
3 of 4 series · 16 of 47 positions shown, 19 images · non-contrast
Comparison: None.

CLINICAL DATA: [AGE] female with fall.

EXAM:
CT HEAD WITHOUT CONTRAST
TECHNIQUE: Contiguous axial images were obtained from the base of the skull
through the vertex without intravenous contrast.

[Series 2: head w o · axial · 0.41mm/px · z∈[+58,+183]mm · 10 of 31 slices shown, 13 images]
[im 3/31  brain]
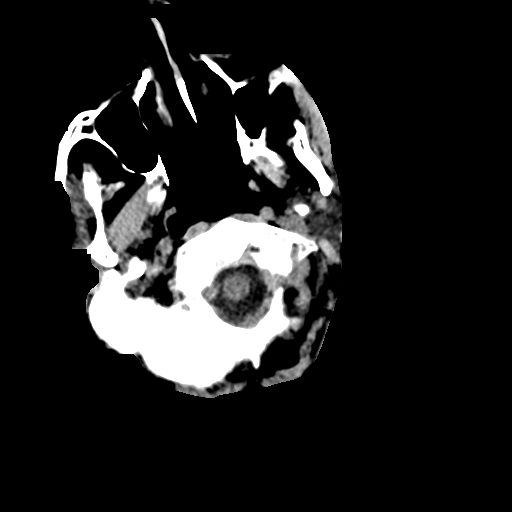
[im 3/31  bone]
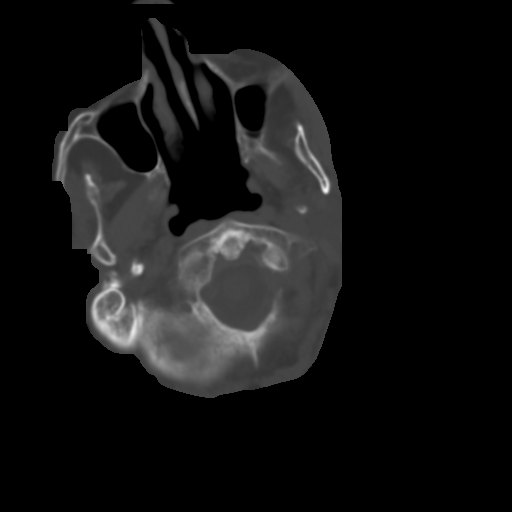
[im 5/31  brain]
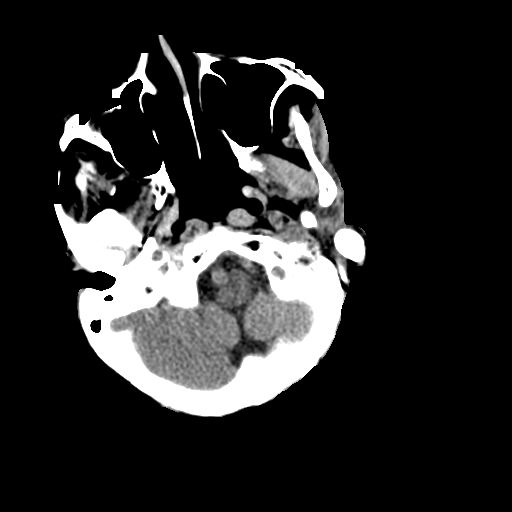
[im 9/31  brain]
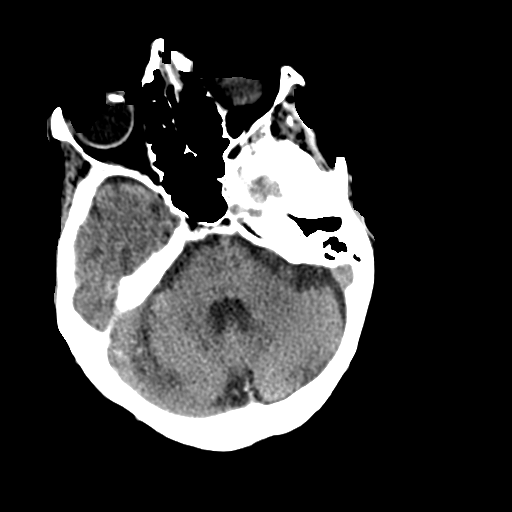
[im 11/31  brain]
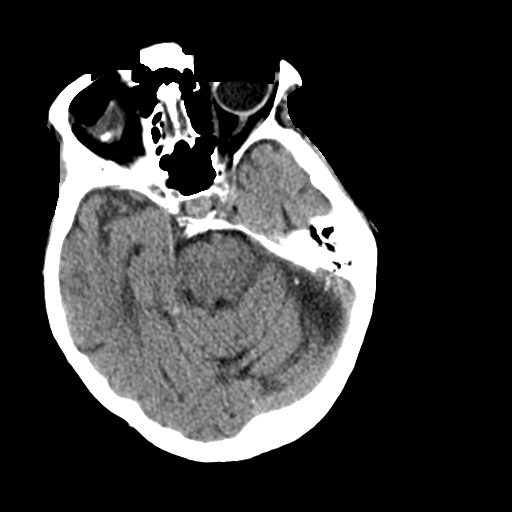
[im 13/31  brain]
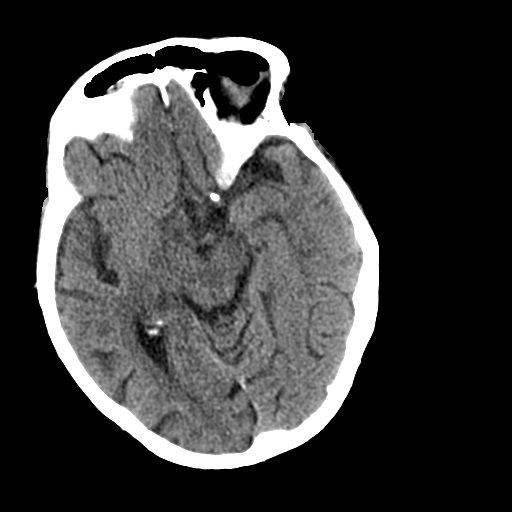
[im 13/31  bone]
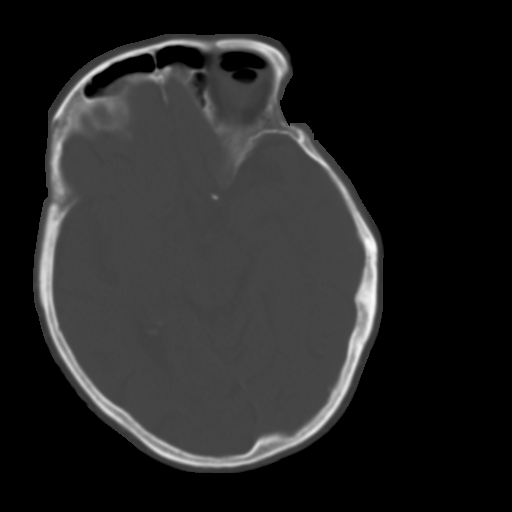
[im 18/31  brain]
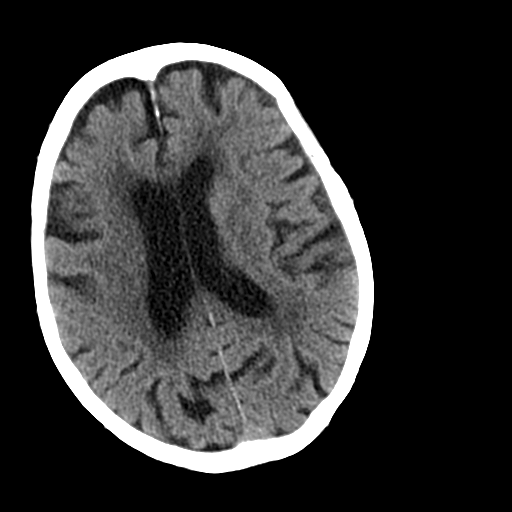
[im 20/31  brain]
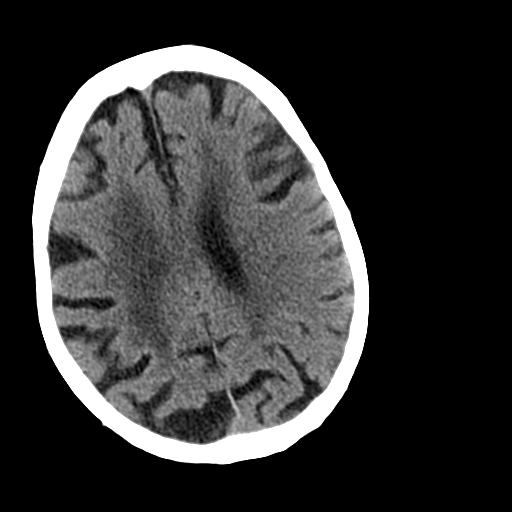
[im 22/31  brain]
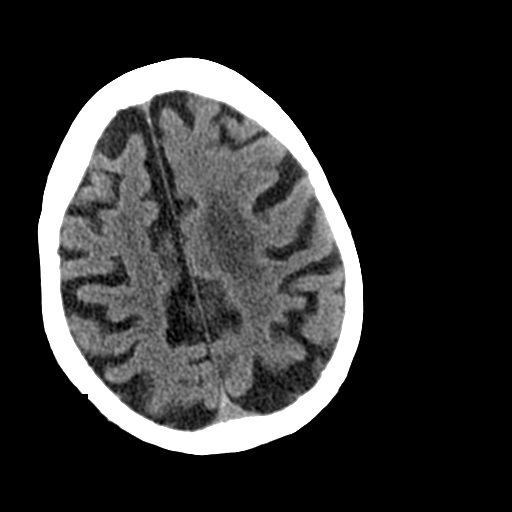
[im 26/31  brain]
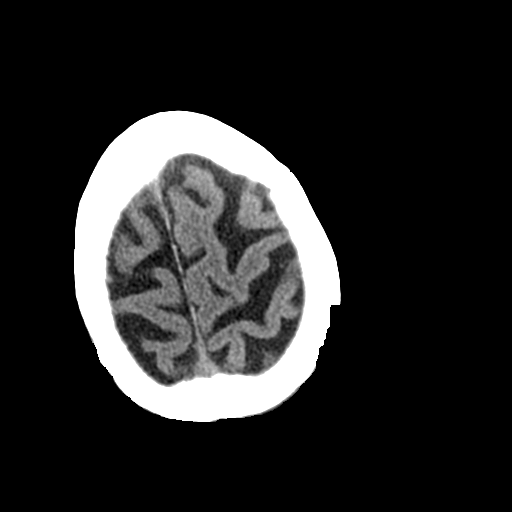
[im 26/31  bone]
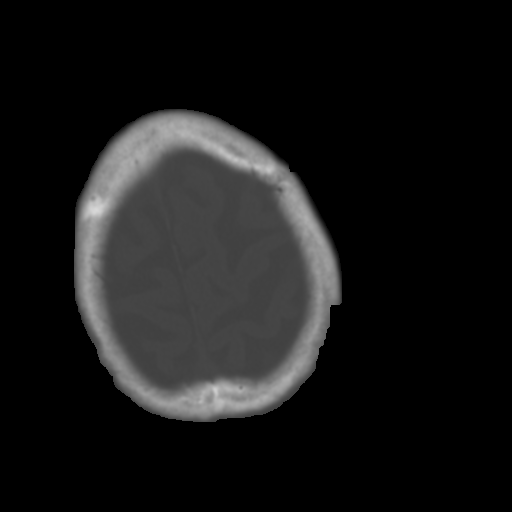
[im 28/31  brain]
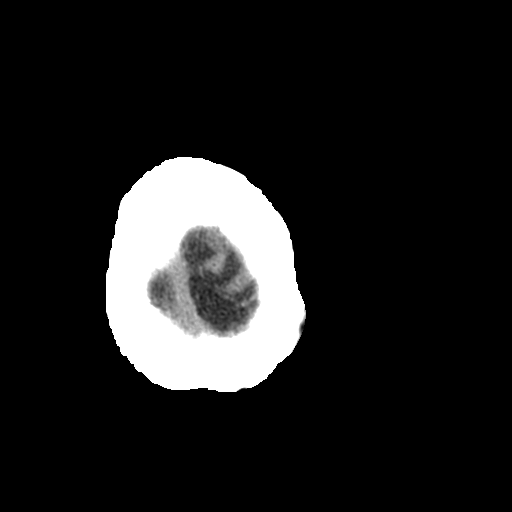

[Series 4: coronal soft · coronal · 0.31mm/px · 3 of 78 slices shown]
[im 26/78  brain]
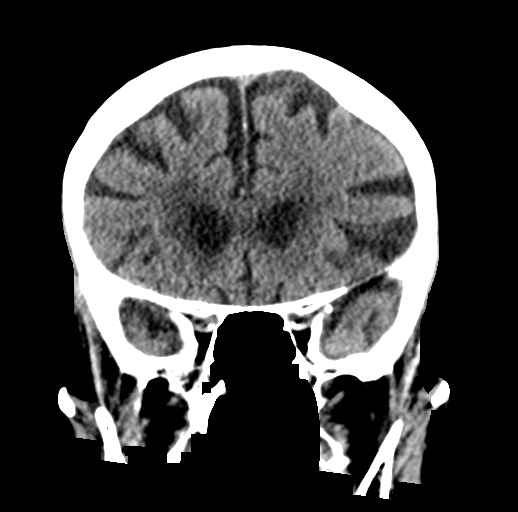
[im 35/78  brain]
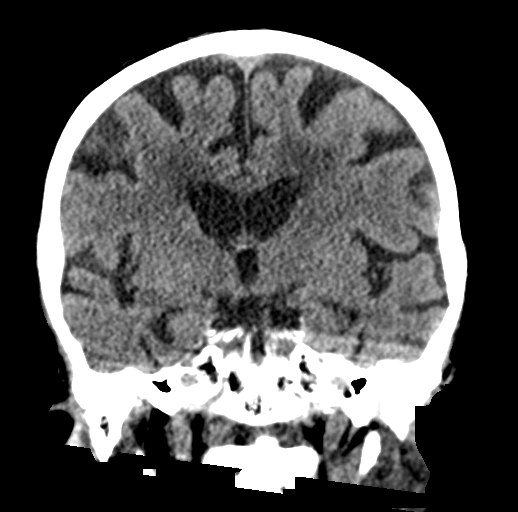
[im 43/78  brain]
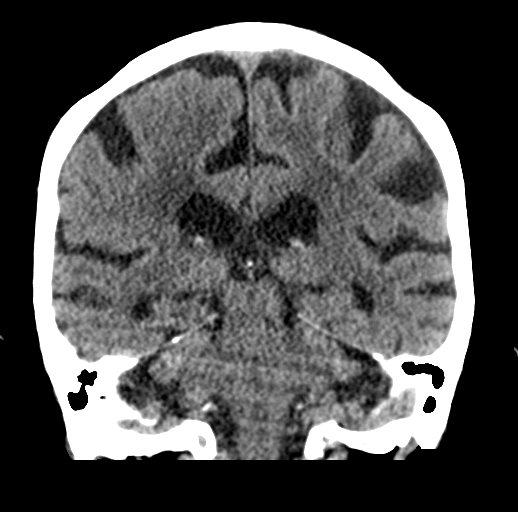

[Series 5: sagittal soft · sagittal · 0.34mm/px · 3 of 55 slices shown]
[im 20/55  brain]
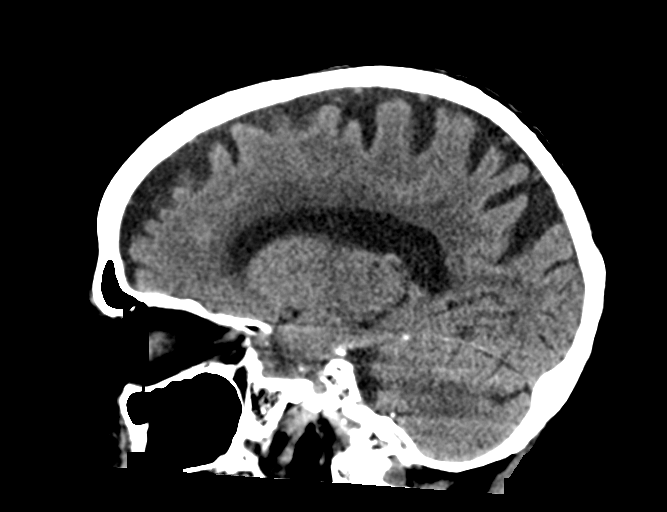
[im 28/55  brain]
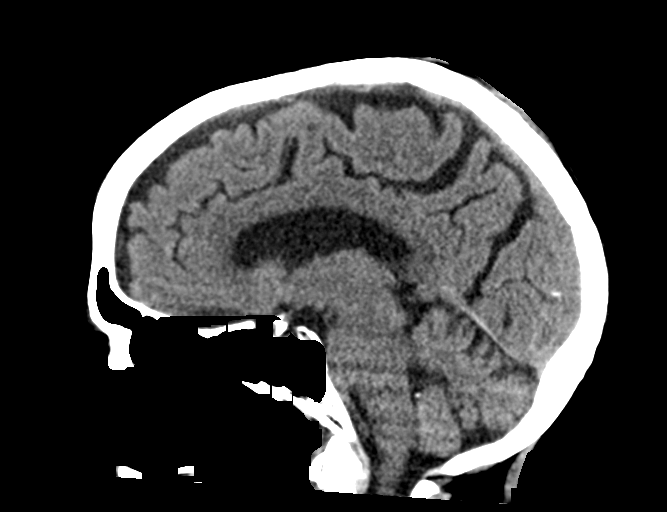
[im 35/55  brain]
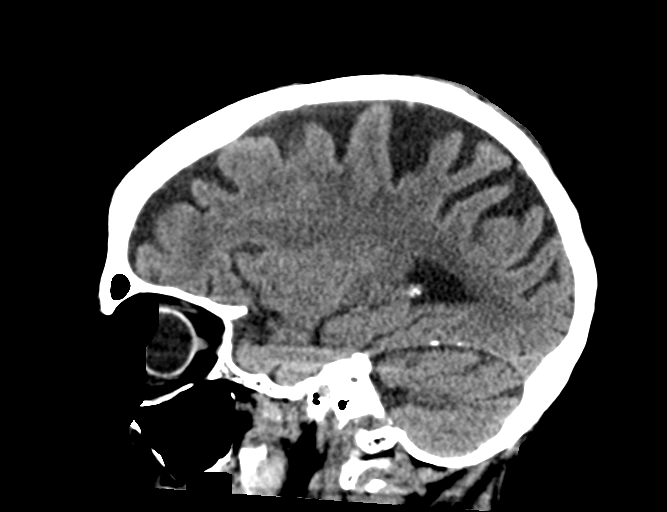

[16 of 47 positions shown; findings below may reference images not displayed]

FINDINGS: Brain: There is mild age-related atrophy and chronic microvascular
ischemic changes. There is no acute intracranial hemorrhage. No mass
effect or midline shift. No extra-axial fluid collection.

Vascular: No hyperdense vessel or unexpected calcification.

Skull: Normal. Negative for fracture or focal lesion.

Sinuses/Orbits: No acute finding.

Other: Left posterior parietal scalp contusion.
IMPRESSION: 1. No acute intracranial hemorrhage.
2. Mild age-related atrophy and chronic microvascular ischemic
changes.

## 2021-02-04 IMAGING — DX DG HAND COMPLETE 3+V*L*
3 series · 3 of 3 positions shown · non-contrast
Comparison: None.

CLINICAL DATA: Fall, pain

EXAM:
LEFT HAND - COMPLETE 3+ VIEW

[hand ap]
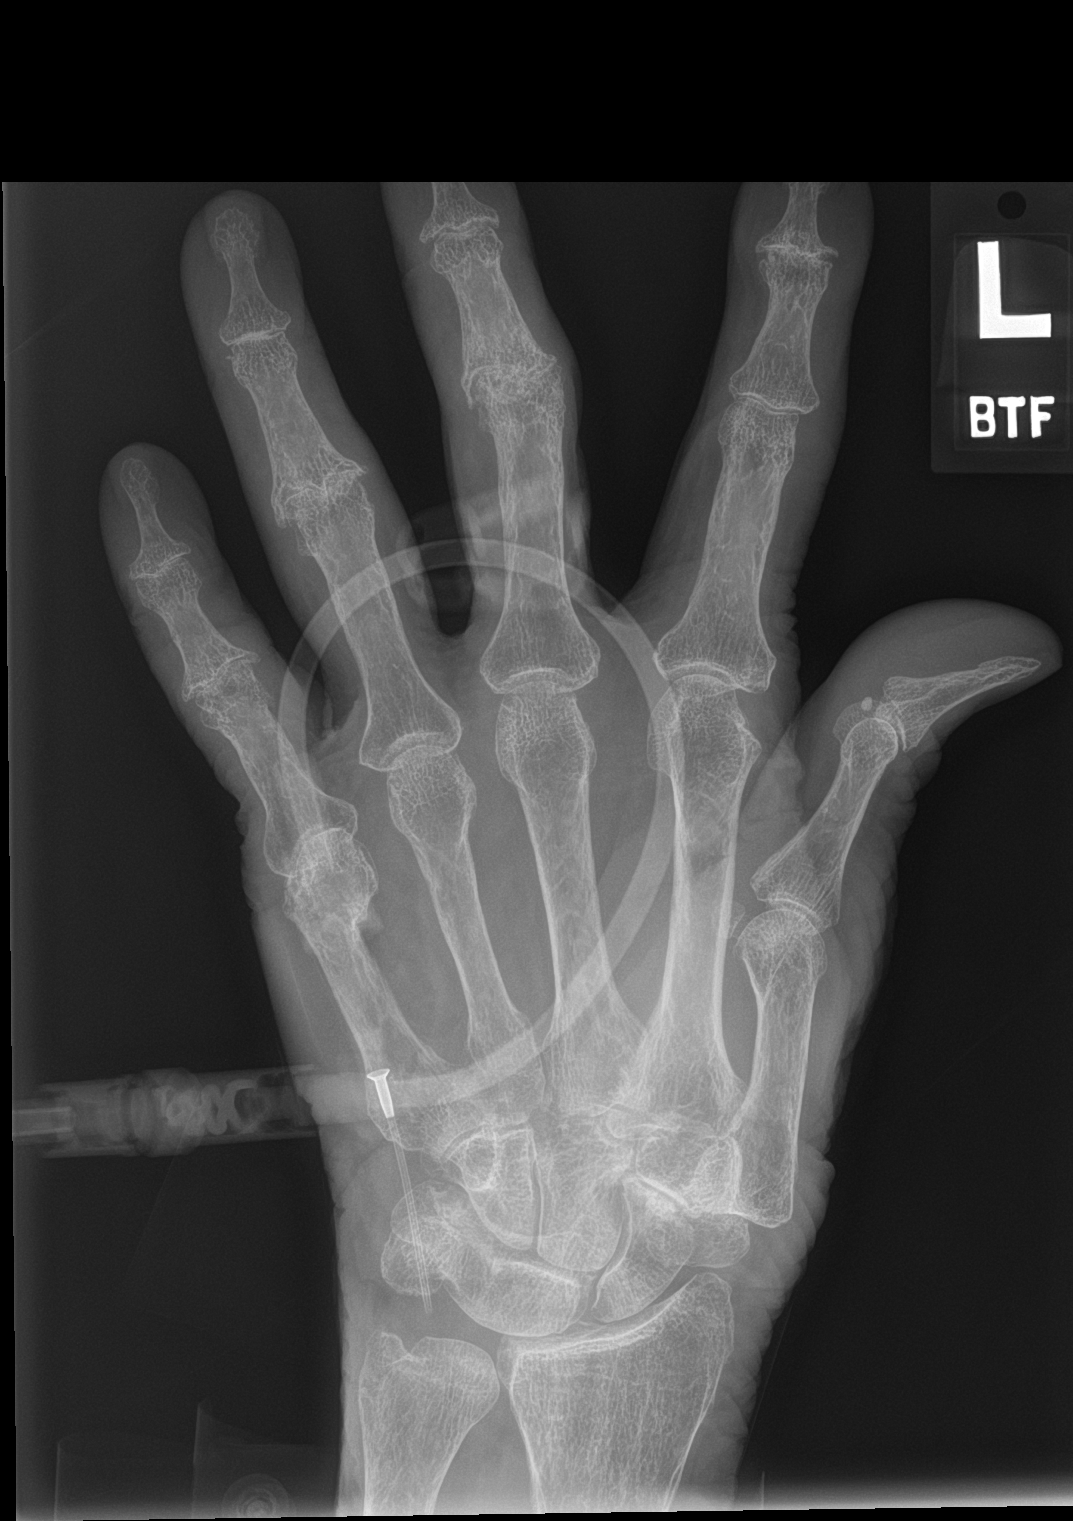

[hand obl]
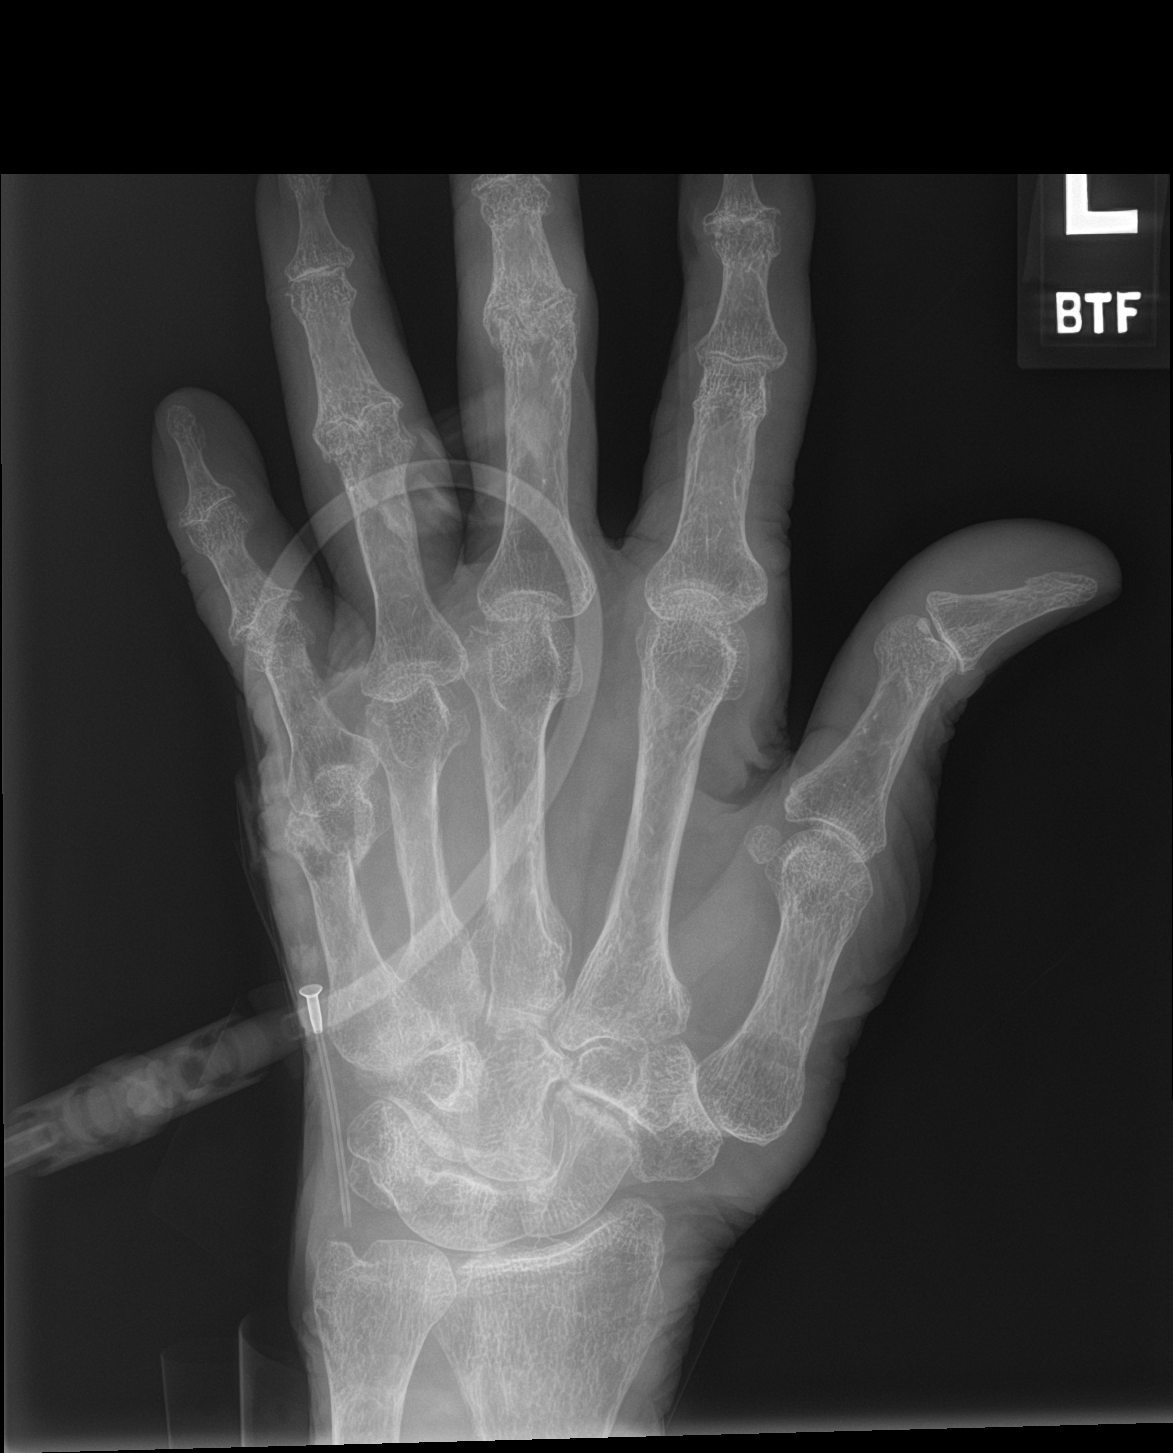

[hand lat]
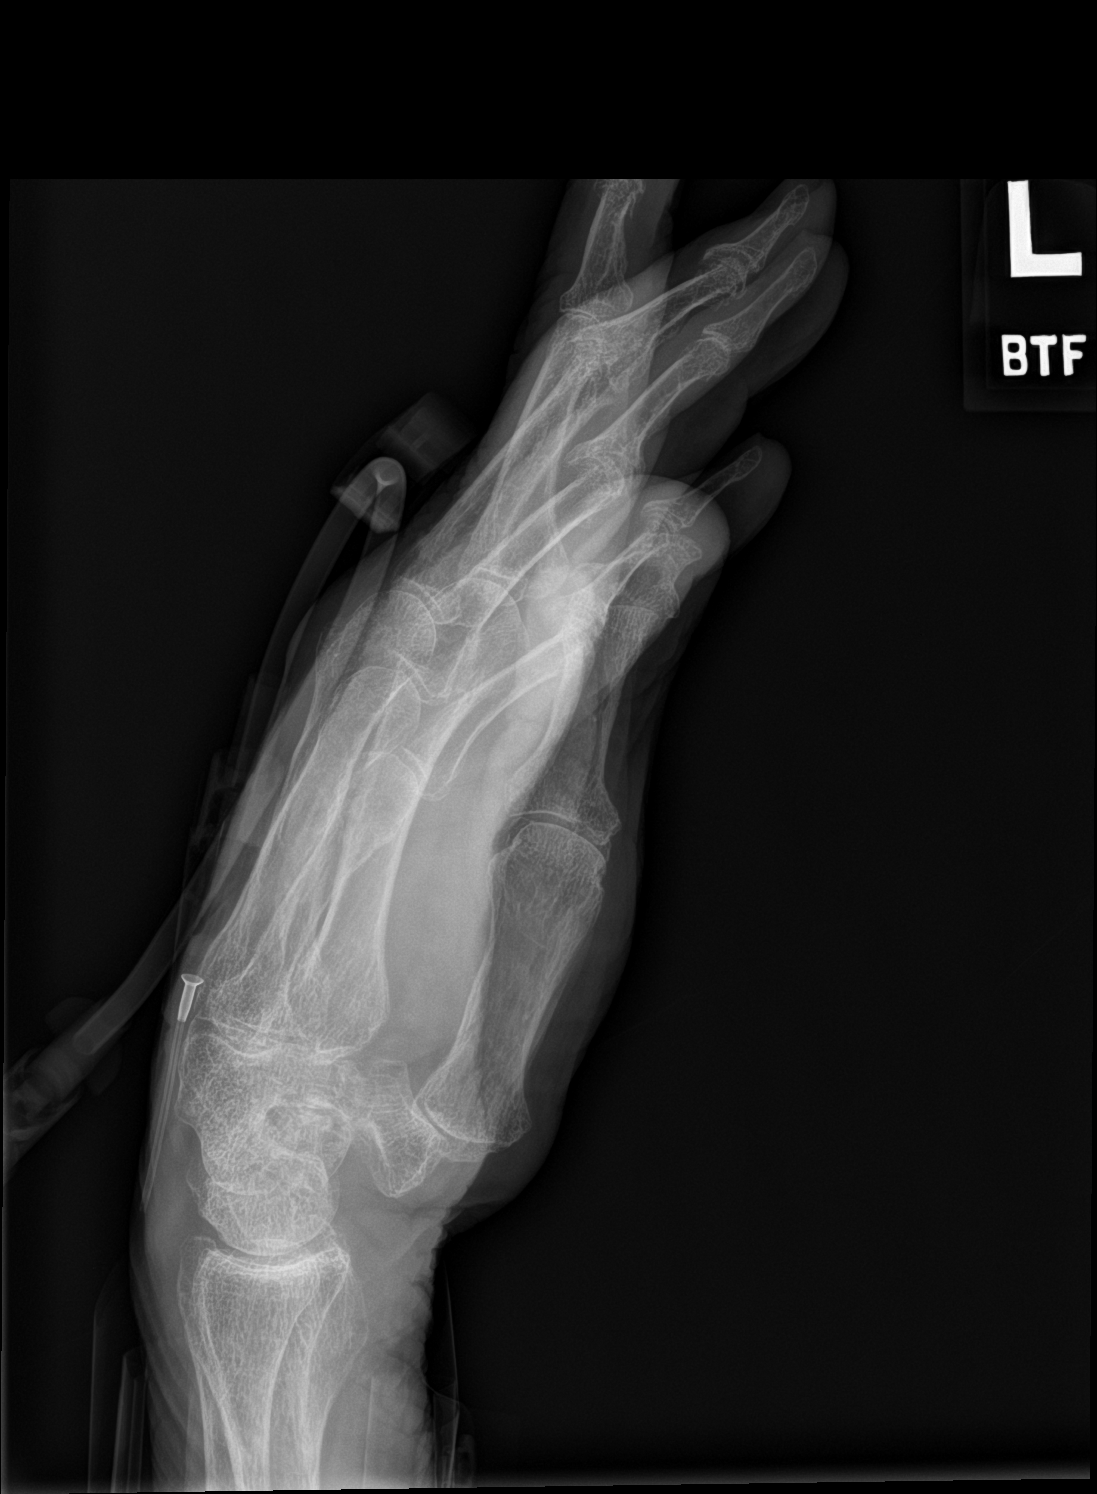

[3 of 3 positions shown; findings below may reference images not displayed]

FINDINGS: Profound osteopenia. There is an angulated fracture of the distal
left fifth metacarpal. Moderate osteoarthritic pattern arthrosis
about the hand and wrist. Intravenous catheter in the dorsum of the
wrist.
IMPRESSION: 1. Profound osteopenia.

2. There is an angulated fracture of the distal left fifth
metacarpal.

3. Moderate osteoarthritic pattern arthrosis about the hand and
wrist.
